# Patient Record
Sex: Female | Born: 1995 | Race: Black or African American | Hispanic: No | Marital: Single | Smoking: Never smoker
Health system: Southern US, Community
[De-identification: ages and names within clinical notes are randomized; demographics above are authoritative.]

## PROBLEM LIST (undated history)

## (undated) ENCOUNTER — Encounter

## (undated) ENCOUNTER — Telehealth

## (undated) ENCOUNTER — Ambulatory Visit

## (undated) ENCOUNTER — Encounter
Attending: Student in an Organized Health Care Education/Training Program | Primary: Student in an Organized Health Care Education/Training Program

## (undated) ENCOUNTER — Ambulatory Visit: Attending: Pharmacist | Primary: Pharmacist

## (undated) ENCOUNTER — Telehealth
Attending: Student in an Organized Health Care Education/Training Program | Primary: Student in an Organized Health Care Education/Training Program

## (undated) ENCOUNTER — Ambulatory Visit: Payer: PRIVATE HEALTH INSURANCE

## (undated) ENCOUNTER — Encounter: Attending: Family Medicine | Primary: Family Medicine

## (undated) ENCOUNTER — Encounter: Attending: Internal Medicine | Primary: Internal Medicine

## (undated) ENCOUNTER — Encounter: Payer: Worker's Compensation | Attending: Surgical Oncology | Primary: Surgical Oncology

## (undated) ENCOUNTER — Ambulatory Visit: Payer: MEDICAID | Attending: Internal Medicine | Primary: Internal Medicine

## (undated) DIAGNOSIS — G43909 Migraine, unspecified, not intractable, without status migrainosus: Secondary | ICD-10-CM

## (undated) DIAGNOSIS — T7840XA Allergy, unspecified, initial encounter: Secondary | ICD-10-CM

## (undated) HISTORY — DX: Allergy, unspecified, initial encounter: T78.40XA

## (undated) HISTORY — PX: FRACTURE SURGERY: SHX138

## (undated) HISTORY — DX: Migraine, unspecified, not intractable, without status migrainosus: G43.909

## (undated) MED ORDER — CHOLECALCIFEROL (VITAMIN D3) 1,250 MCG (50,000 UNIT) CAPSULE: 0.00000 days

## (undated) MED ORDER — MOMETASONE 0.1 % TOPICAL OINTMENT: 0.00000 days

## (undated) MED ORDER — EUCRISA 2 % TOPICAL OINTMENT: 0.00000 days

---

## 2015-08-12 ENCOUNTER — Ambulatory Visit (INDEPENDENT_AMBULATORY_CARE_PROVIDER_SITE_OTHER): Payer: Commercial Managed Care - PPO | Admitting: Emergency Medicine

## 2015-08-12 ENCOUNTER — Ambulatory Visit (INDEPENDENT_AMBULATORY_CARE_PROVIDER_SITE_OTHER): Payer: Commercial Managed Care - PPO

## 2015-08-12 ENCOUNTER — Other Ambulatory Visit: Payer: Self-pay | Admitting: Emergency Medicine

## 2015-08-12 VITALS — BP 102/68 | HR 74 | Temp 98.7°F | Resp 16 | Ht 59.0 in | Wt 87.0 lb

## 2015-08-12 DIAGNOSIS — M2392 Unspecified internal derangement of left knee: Secondary | ICD-10-CM

## 2015-08-12 MED ORDER — NAPROXEN SODIUM 550 MG PO TABS: 550.0000 mg | ORAL_TABLET | Freq: Two times a day (BID) | ORAL | Status: DC

## 2015-08-12 NOTE — Patient Instructions (Signed)

## 2015-08-12 NOTE — Progress Notes (Signed)
Subjective:  Patient ID: Chelsea Butler, female    DOB: Mar 04, 1996  Age: 19 y.o. MRN: [id number]  CC: Leg Pain   HPI Baudelia Schroepfer presents  with pain in her left knee. She was playing basketball yesterday and injured her knee. She's not sure how the injury occurred. She noticed she was unable to bear weight comfortably. Worsen today. She's had no particular swelling. She has no limitation of motion of her knee. No clicking or locking. Has taken no medication for treatment of her pain. She has no history of prior knee injury. 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   She has past surgical history that includes Fracture surgery.   Her  family history is not on file.  She   reports that she has never smoked. She does not have any smokeless tobacco history on file. Her alcohol and drug histories are not on file.  No outpatient prescriptions prior to visit.   No facility-administered medications prior to visit.    Social History   Social History  . Marital Status: Single    Spouse Name: N/A  . Number of Children: N/A  . Years of Education: N/A   Social History Main Topics  . Smoking status: Never Smoker   . Smokeless tobacco: None  . Alcohol Use: None  . Drug Use: None  . Sexual Activity: Not Asked   Other Topics Concern  . None   Social History Narrative  . None     Review of Systems  Constitutional: Negative for fever, chills and appetite change.  HENT: Negative for congestion, ear pain, postnasal drip, sinus pressure and sore throat.   Eyes: Negative for pain and redness.  Respiratory: Negative for cough, shortness of breath and wheezing.   Cardiovascular: Negative for leg swelling.  Gastrointestinal: Negative for nausea, vomiting, abdominal pain, diarrhea, constipation and blood in stool.  Endocrine: Negative for polyuria.  Genitourinary: Negative for dysuria, urgency, frequency and flank pain.    Musculoskeletal: Negative for gait problem.  Skin: Negative for rash.  Neurological: Negative for weakness and headaches.  Psychiatric/Behavioral: Negative for confusion and decreased concentration. The patient is not nervous/anxious.     Objective:  BP 102/68 mmHg  Pulse 74  Temp(Src) 98.7 F (37.1 C) (Oral)  Resp 16  Ht 4\' 11"  (1.499 m)  Wt 87 lb (39.463 kg)  BMI 17.56 kg/m2  SpO2 98%  LMP 08/04/2015  Physical Exam  Constitutional: She is oriented to person, place, and time. She appears well-developed and well-nourished. No distress.  HENT:  Head: Normocephalic and atraumatic.  Right Ear: External ear normal.  Left Ear: External ear normal.  Nose: Nose normal.  Eyes: Conjunctivae and EOM are normal. Pupils are equal, round, and reactive to light. No scleral icterus.  Neck: Normal range of motion. Neck supple. No tracheal deviation present.  Cardiovascular: Normal rate, regular rhythm and normal heart sounds.   Pulmonary/Chest: Effort normal. No respiratory distress. She has no wheezes. She has no rales.  Abdominal: She exhibits no mass. There is no tenderness. There is no rebound and no guarding.  Musculoskeletal: She exhibits no edema.  Lymphadenopathy:    She has no cervical adenopathy.  Neurological: She is alert and oriented to person, place, and time. Coordination normal.  Skin: Skin is warm and dry. No rash noted.  Psychiatric: She has a normal mood and affect. Her behavior is normal.      Assessment &  Plan:   Julieana was seen today for leg pain.  Diagnoses and all orders for this visit:  Internal derangement of knee, left -     Cancel: DG Knee Complete 4 Views Left; Future  Other orders -     naproxen sodium (ANAPROX DS) 550 MG tablet; Take 1 tablet (550 mg total) by mouth 2 (two) times daily with a meal.   I am having Ms. Corine Shelter start on naproxen sodium. I am also having her maintain her norethindrone-ethinyl estradiol-iron.  Meds ordered this  encounter  Medications  . norethindrone-ethinyl estradiol-iron (ESTROSTEP FE,TILIA FE,TRI-LEGEST FE) 1-20/1-30/1-35 MG-MCG tablet    Sig: Take 1 tablet by mouth daily.  . naproxen sodium (ANAPROX DS) 550 MG tablet    Sig: Take 1 tablet (550 mg total) by mouth 2 (two) times daily with a meal.    Dispense:  40 tablet    Refill:  0   I asked her to use crutches for nonweightbearing for several days and she can increase her weightbearing as tolerated. She should follow-up in a week if her pain is not improved elevation and ice the meantime  Appropriate red flag conditions were discussed with the patient as well as actions that should be taken.  Patient expressed his understanding.  Follow-up: Return in about 1 week (around 08/19/2015).  Carmelina Dane, MD   [facility] reading (PRIMARY) by  Dr. Dareen Piano negative.

## 2016-02-16 ENCOUNTER — Ambulatory Visit (INDEPENDENT_AMBULATORY_CARE_PROVIDER_SITE_OTHER): Payer: Commercial Managed Care - PPO | Admitting: Family Medicine

## 2016-02-16 VITALS — BP 108/64 | HR 84 | Temp 98.4°F | Resp 17 | Ht 60.0 in | Wt 86.0 lb

## 2016-02-16 DIAGNOSIS — G4452 New daily persistent headache (NDPH): Secondary | ICD-10-CM

## 2016-02-16 DIAGNOSIS — G43709 Chronic migraine without aura, not intractable, without status migrainosus: Secondary | ICD-10-CM | POA: Diagnosis not present

## 2016-02-16 DIAGNOSIS — R109 Unspecified abdominal pain: Secondary | ICD-10-CM | POA: Diagnosis not present

## 2016-02-16 DIAGNOSIS — J301 Allergic rhinitis due to pollen: Secondary | ICD-10-CM | POA: Diagnosis not present

## 2016-02-16 DIAGNOSIS — J0111 Acute recurrent frontal sinusitis: Secondary | ICD-10-CM

## 2016-02-16 LAB — POCT URINALYSIS DIP (MANUAL ENTRY)
BILIRUBIN UA: NEGATIVE
GLUCOSE UA: NEGATIVE
Ketones, POC UA: NEGATIVE
Leukocytes, UA: NEGATIVE
Nitrite, UA: NEGATIVE
PH UA: 7
Protein Ur, POC: NEGATIVE
RBC UA: NEGATIVE
SPEC GRAV UA: 1.02
UROBILINOGEN UA: 0.2

## 2016-02-16 LAB — POC MICROSCOPIC URINALYSIS (UMFC): Mucus: ABSENT

## 2016-02-16 LAB — POCT INFLUENZA A/B
Influenza A, POC: NEGATIVE
Influenza B, POC: NEGATIVE

## 2016-02-16 MED ORDER — AMOXICILLIN 500 MG PO TABS: 1000.0000 mg | ORAL_TABLET | Freq: Two times a day (BID) | ORAL | Status: DC

## 2016-02-16 MED ORDER — FLUTICASONE PROPIONATE 50 MCG/ACT NA SUSP: 2.0000 | Freq: Every day | NASAL | Status: AC

## 2016-02-16 MED ORDER — PREDNISONE 20 MG PO TABS: ORAL_TABLET | ORAL | Status: DC

## 2016-02-16 MED ORDER — IBUPROFEN 200 MG PO TABS
800.0000 mg | ORAL_TABLET | Freq: Once | ORAL | Status: AC
  Administered 2016-02-16: 800 mg via ORAL

## 2016-02-16 NOTE — Progress Notes (Signed)
Subjective:    Patient ID: Chelsea Butler, female    DOB: 09/16/1996, 20 y.o.   MRN: [id number]  02/16/2016  Migraine   HPI This 20 y.o. female presents for evaluation of migraine for five days.  Onset of migraines during childhood. Can usually take Ibuprofen with relief of symptoms.  Less frequent than in childhood.  Pressure behind both eyes which is not typical for migraine.  +photophobia initially; no phonophobia.  +nausea. Diffuse headache; migraines are usually one sided.  Severity 7/10.  Last Ibuprofen at 9:00pm; took 2 Ibuprofen 200mg  OTC.  No dizziness; no n/t.  No confusion.  No neck pain.  Diagnosed with migraines family physician; recently going to physician in [place]/neurologist. Has prescribed medication for migraines; cannot recall medications.     Recent illness.  Onset one week ago.  Pressure behind eyes developed three days ago.  No fever +chills/sweats.  +lower back pain on L this morning which is new.  No sore throat; no ear pain.  +rhinorrhea; no nasal congestion; clear rhinorrhea; no sneezing; eyes watering a lot; eyes are not itchy.  No facial itching; no coughing; no v/d.  No dysuria, hematuria, frequency, nocturia.  Taking Zyrtec daily.  Review of Systems  Constitutional: Negative for fever, chills, diaphoresis and fatigue.  Eyes: Negative for visual disturbance.  Respiratory: Negative for cough and shortness of breath.   Cardiovascular: Negative for chest pain, palpitations and leg swelling.  Gastrointestinal: Negative for nausea, vomiting, abdominal pain, diarrhea and constipation.  Endocrine: Negative for cold intolerance, heat intolerance, polydipsia, polyphagia and polyuria.  Neurological: Negative for dizziness, tremors, seizures, syncope, facial asymmetry, speech difficulty, weakness, light-headedness, numbness and headaches.    Past Medical History  Diagnosis Date  . Allergy   . Migraine    Past Surgical History  Procedure Laterality Date  .  Fracture surgery     No Known Allergies  Social History   Social History  . Marital Status: Single    Spouse Name: N/A  . Number of Children: N/A  . Years of Education: N/A   Occupational History  . student     [employer]   Social History Main Topics  . Smoking status: Never Smoker   . Smokeless tobacco: Not on file  . Alcohol Use: Not on file  . Drug Use: Not on file  . Sexual Activity: Not on file   Other Topics Concern  . Not on file   Social History Narrative   History reviewed. No pertinent family history.     Objective:    BP 108/64 mmHg  Pulse 84  Temp(Src) 98.4 F (36.9 C) (Oral)  Resp 17  Ht 5' (1.524 m)  Wt 86 lb (39.009 kg)  BMI 16.80 kg/m2  SpO2 96%  LMP 02/07/2016 Physical Exam  Constitutional: She is oriented to person, place, and time. She appears well-developed and well-nourished. No distress.  HENT:  Head: Normocephalic and atraumatic.  Right Ear: Tympanic membrane, external ear and ear canal normal.  Left Ear: Tympanic membrane, external ear and ear canal normal.  Nose: Mucosal edema and rhinorrhea present. Right sinus exhibits frontal sinus tenderness. Right sinus exhibits no maxillary sinus tenderness. Left sinus exhibits frontal sinus tenderness. Left sinus exhibits no maxillary sinus tenderness.  Mouth/Throat: Uvula is midline, oropharynx is clear and moist and mucous membranes are normal.  Eyes: Conjunctivae and EOM are normal. Pupils are equal, round, and reactive to light.  Neck: Normal range of motion. Neck supple. Carotid bruit is not present. No  thyromegaly present.  Cardiovascular: Normal rate, regular rhythm, normal heart sounds and intact distal pulses.  Exam reveals no gallop and no friction rub.   No murmur heard. Pulmonary/Chest: Effort normal and breath sounds normal. She has no wheezes. She has no rales.  Abdominal: Soft. Bowel sounds are normal. She exhibits no distension and no mass. There is no tenderness. There is no rebound  and no guarding.  Musculoskeletal:       Lumbar back: She exhibits tenderness and pain. She exhibits normal range of motion, no bony tenderness, no swelling and no spasm.       Back:  Lumbar spine:  Non-tender midline; +L paraspinal regions B.  Straight leg raises negative B; toe and heel walking intact; marching intact; motor 5/5 BLE.  Full ROM lumbar spine without limitation.   Lymphadenopathy:    She has no cervical adenopathy.  Neurological: She is alert and oriented to person, place, and time. No cranial nerve deficit.  Skin: Skin is warm and dry. No rash noted. She is not diaphoretic. No erythema. No pallor.  Psychiatric: She has a normal mood and affect. Her behavior is normal.   Results for orders placed or performed in visit on 02/16/16  POCT urinalysis dipstick  Result Value Ref Range   Color, UA yellow yellow   Clarity, UA clear clear   Glucose, UA negative negative   Bilirubin, UA negative negative   Ketones, POC UA negative negative   Spec Grav, UA 1.020    Blood, UA negative negative   pH, UA 7.0    Protein Ur, POC negative negative   Urobilinogen, UA 0.2    Nitrite, UA Negative Negative   Leukocytes, UA Negative Negative  POCT Microscopic Urinalysis ([facility])  Result Value Ref Range   WBC,UR,HPF,POC None None WBC/hpf   RBC,UR,HPF,POC None None RBC/hpf   Bacteria None None, Too numerous to count   Mucus Absent Absent   Epithelial Cells, UR Per Microscopy Few (A) None, Too numerous to count cells/hpf  POCT Influenza A/B  Result Value Ref Range   Influenza A, POC Negative Negative   Influenza B, POC Negative Negative   IBUPROFEN  PO ADMINISTERED IN OFFICE.    Assessment & Plan:   1. Left flank pain   2. New daily persistent headache   3. Chronic migraine without aura without status migrainosus, not intractable   4. Allergic rhinitis due to pollen   5. Acute recurrent frontal sinusitis    -New. -Rx for Prednisone, Amoxicillin, Flonase  provided. -continue Zyrtec daily.  Orders Placed This Encounter  Procedures  . POCT urinalysis dipstick  . POCT Microscopic Urinalysis ([facility])  . POCT Influenza A/B   Meds ordered this encounter  Medications  . ibuprofen (ADVIL,MOTRIN) tablet 800 mg    Sig:   . predniSONE (DELTASONE) 20 MG tablet    Sig: Two tablets daily x 3 days then one tablet daily x 3 days    Dispense:  9 tablet    Refill:  0  . amoxicillin (AMOXIL) 500 MG tablet    Sig: Take 2 tablets (1,000 mg total) by mouth 2 (two) times daily.    Dispense:  40 tablet    Refill:  0  . fluticasone (FLONASE) 50 MCG/ACT nasal spray    Sig: Place 2 sprays into both nostrils daily.    Dispense:  16 g    Refill:  6    Return if symptoms worsen or fail to improve.    Parvin Stetzer Paulita Fujita,  M.D. [facility] [address] [place], [place]  [address] [phone] phone [phone] fax

## 2016-11-20 IMAGING — CR DG KNEE AP/LAT W/ SUNRISE*L*
3 series · 3 of 3 positions shown · non-contrast
Comparison: None.

CLINICAL DATA: Medial knee pain, no injury

EXAM:
LEFT KNEE 3 VIEWS

[AP]
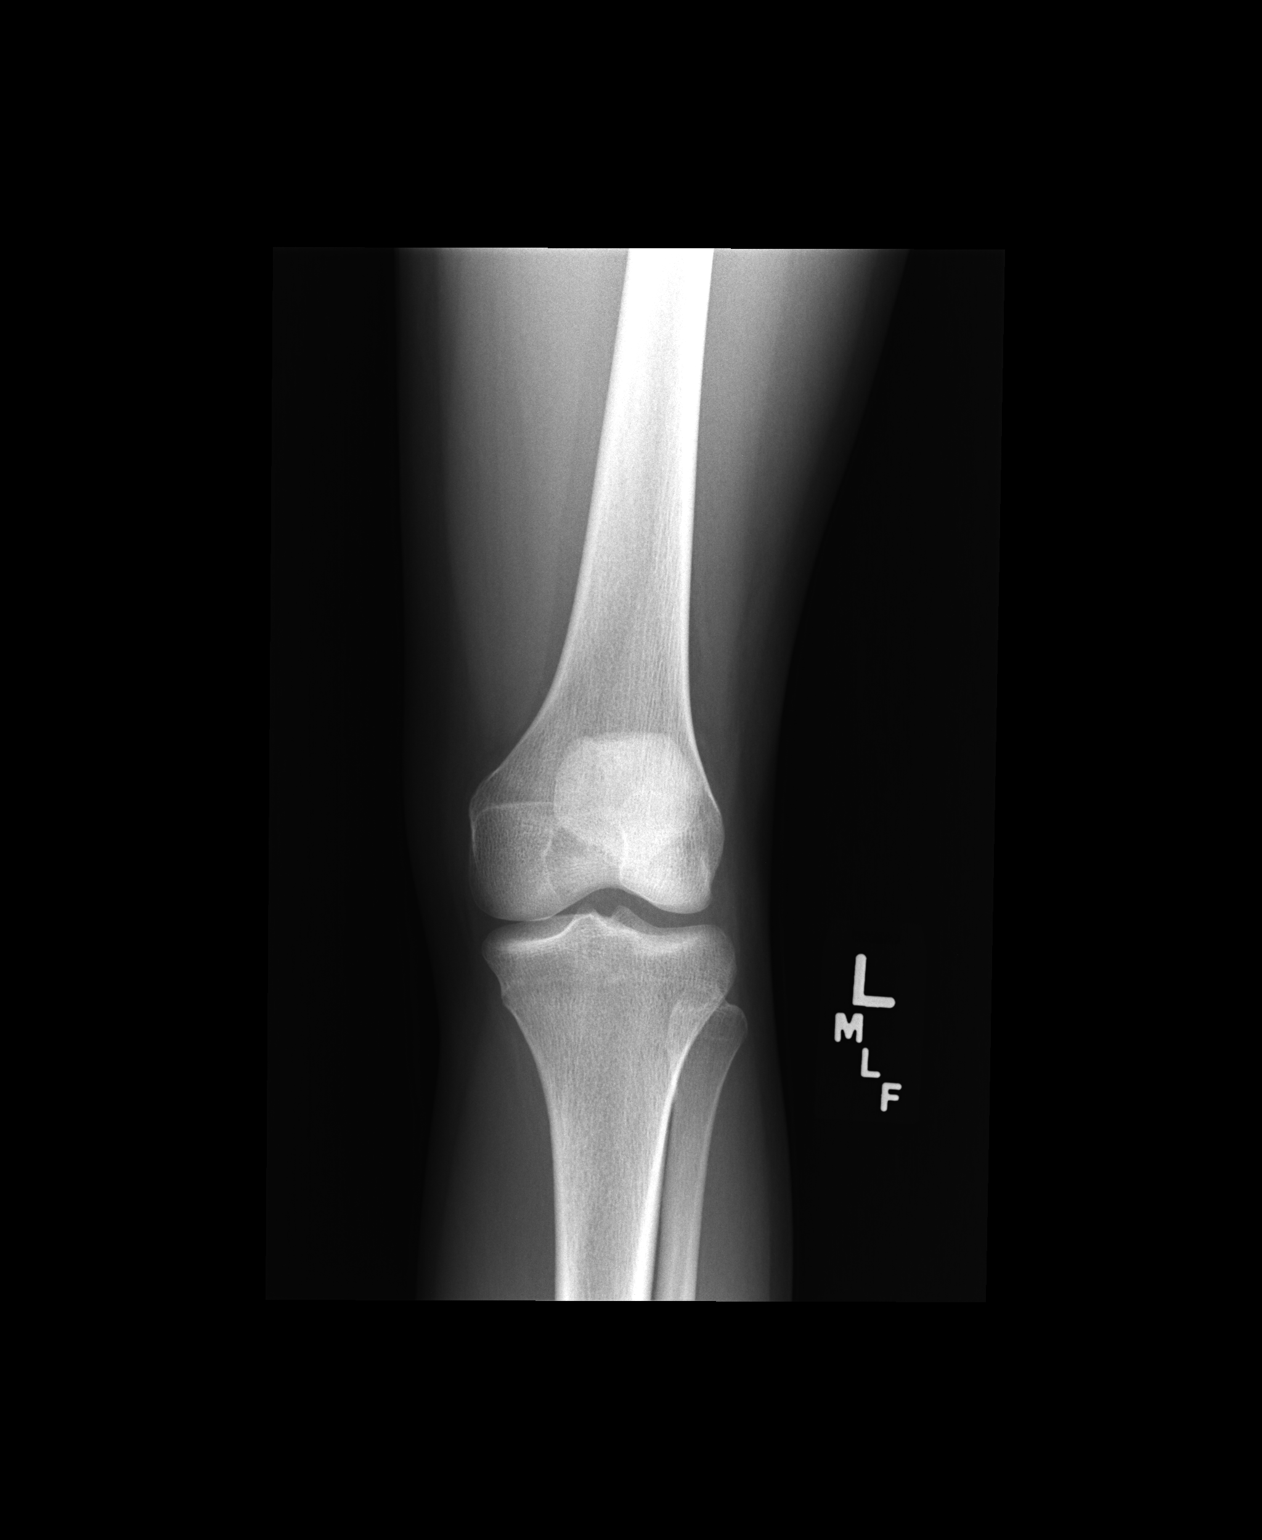

[lateral]
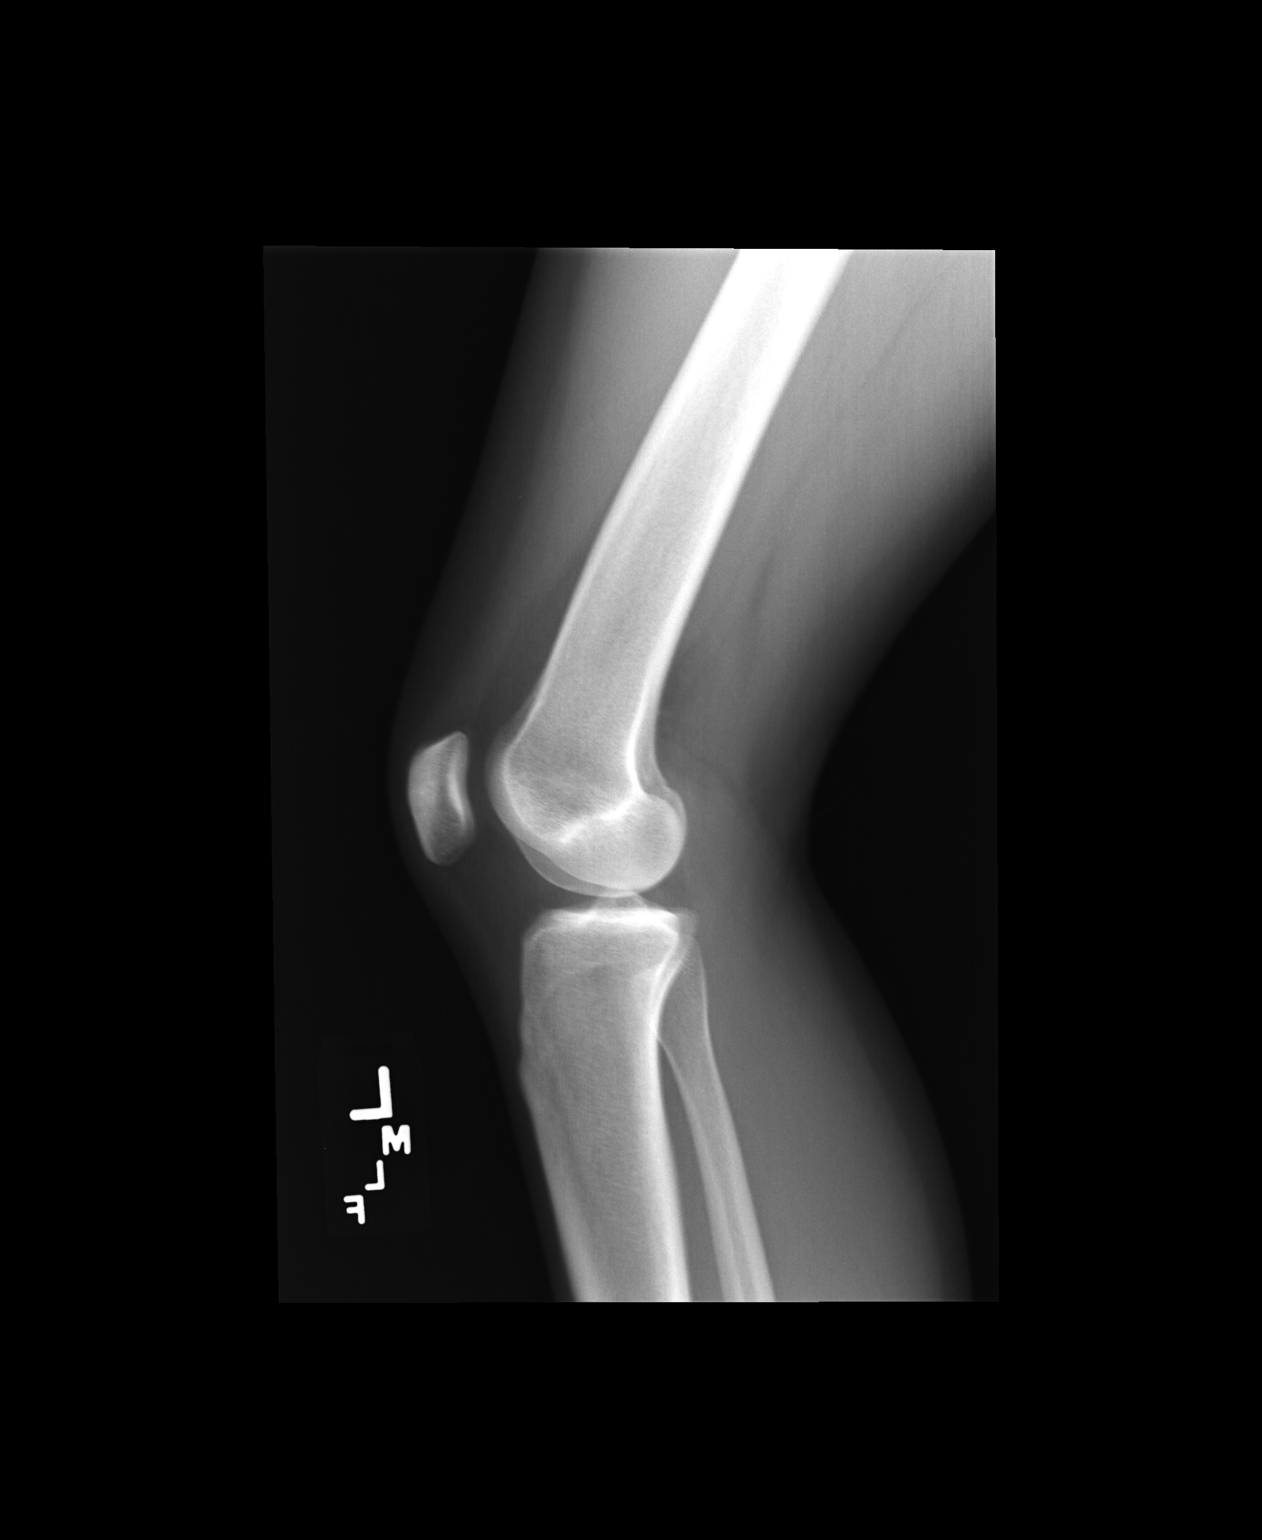

[sunrise]
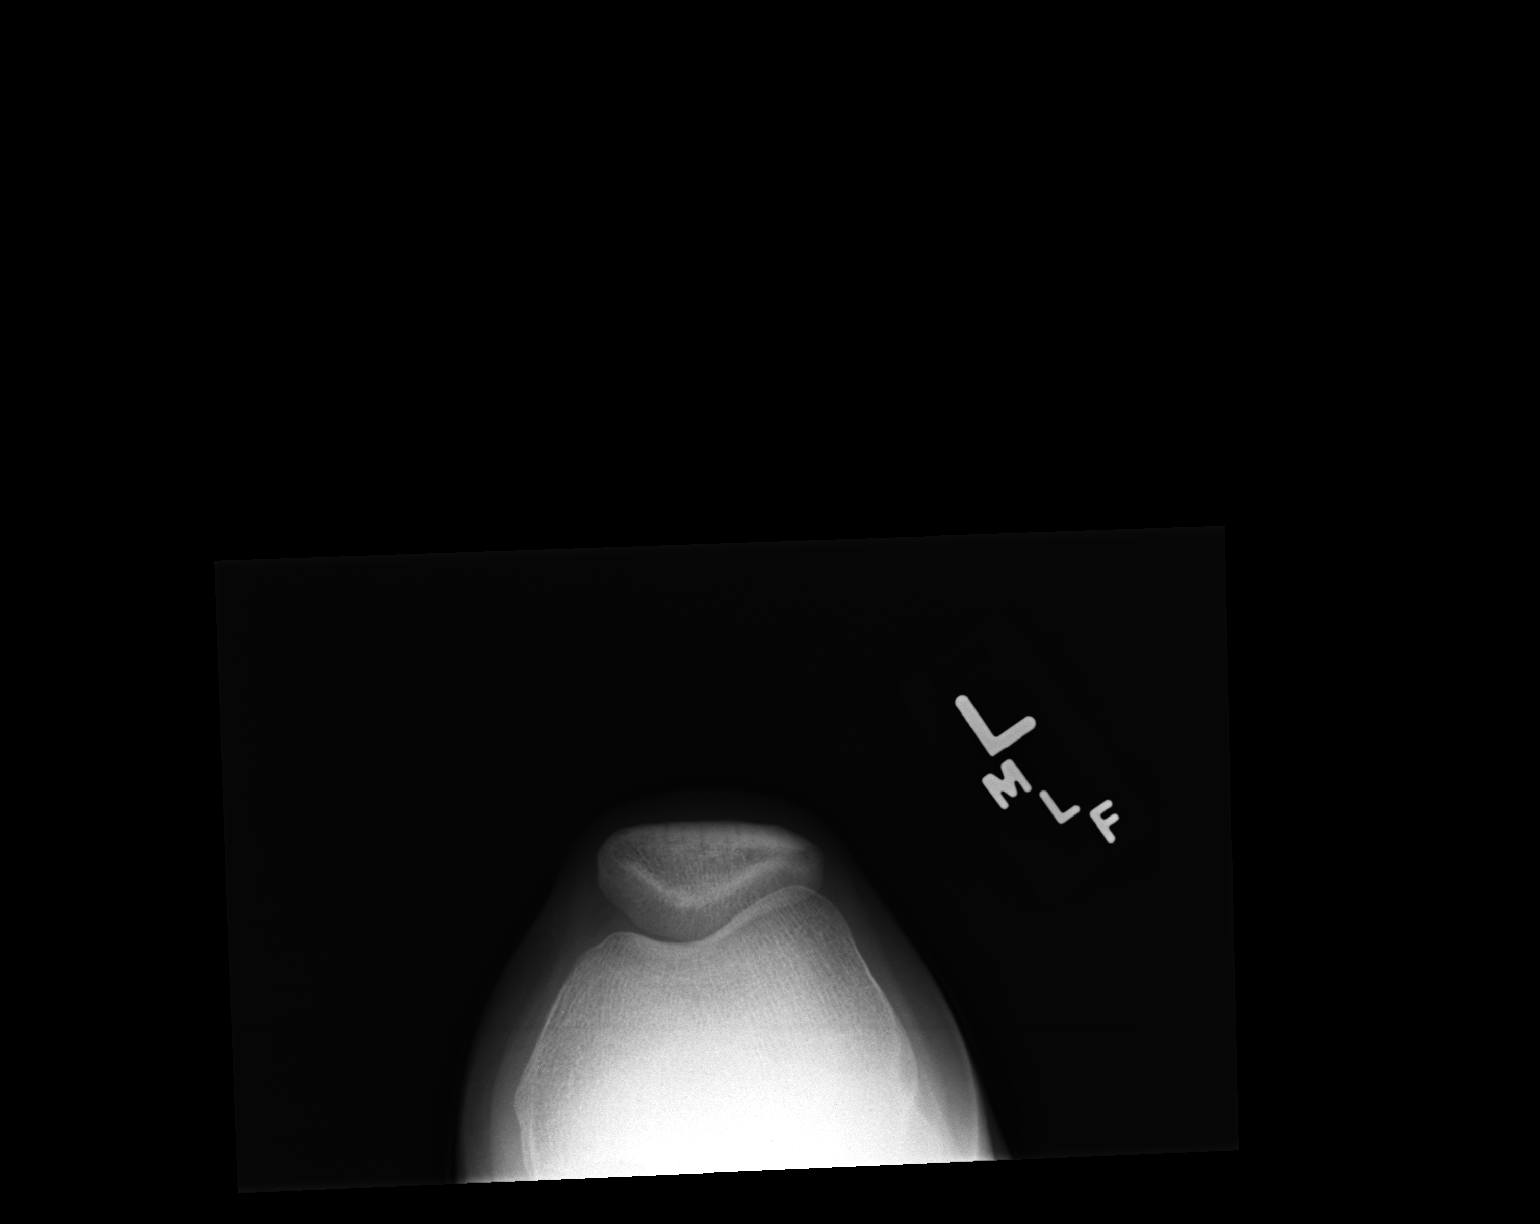

[3 of 3 positions shown; findings below may reference images not displayed]

FINDINGS: The knee joint spaces appear normal. No fracture is seen. No joint
effusion is noted.
IMPRESSION: Negative.

## 2018-02-21 ENCOUNTER — Encounter
Admit: 2018-02-21 | Discharge: 2018-02-21 | Payer: Worker's Compensation | Attending: Obstetrics & Gynecology | Primary: Obstetrics & Gynecology

## 2018-02-21 DIAGNOSIS — Z139 Encounter for screening, unspecified: Secondary | ICD-10-CM

## 2018-02-21 DIAGNOSIS — Z113 Encounter for screening for infections with a predominantly sexual mode of transmission: Principal | ICD-10-CM

## 2019-06-26 ENCOUNTER — Encounter
Admit: 2019-06-26 | Discharge: 2019-06-26 | Payer: Worker's Compensation | Attending: Obstetrics & Gynecology | Primary: Obstetrics & Gynecology

## 2019-06-26 DIAGNOSIS — Z113 Encounter for screening for infections with a predominantly sexual mode of transmission: Principal | ICD-10-CM

## 2019-11-24 ENCOUNTER — Encounter
Admit: 2019-11-24 | Discharge: 2019-11-25 | Payer: Worker's Compensation | Attending: Surgical Oncology | Primary: Surgical Oncology

## 2020-01-19 ENCOUNTER — Encounter
Admit: 2020-01-19 | Discharge: 2020-01-20 | Payer: Worker's Compensation | Attending: Surgical Oncology | Primary: Surgical Oncology

## 2020-01-30 ENCOUNTER — Ambulatory Visit: Payer: Commercial Managed Care - PPO | Admitting: Nurse Practitioner

## 2020-02-05 ENCOUNTER — Ambulatory Visit: Payer: Commercial Managed Care - PPO | Admitting: Nurse Practitioner

## 2020-02-05 ENCOUNTER — Encounter: Payer: Self-pay | Admitting: Nurse Practitioner

## 2020-02-05 ENCOUNTER — Other Ambulatory Visit: Payer: Self-pay

## 2020-02-05 VITALS — BP 102/68 | HR 108 | Temp 97.8°F | Ht 59.0 in | Wt 81.4 lb

## 2020-02-05 DIAGNOSIS — L309 Dermatitis, unspecified: Secondary | ICD-10-CM

## 2020-02-05 LAB — COMPREHENSIVE METABOLIC PANEL
ALT: 9 U/L (ref 0–35)
AST: 16 U/L (ref 0–37)
Albumin: 4.1 g/dL (ref 3.5–5.2)
Alkaline Phosphatase: 51 U/L (ref 39–117)
BUN: 10 mg/dL (ref 6–23)
CO2: 27 mEq/L (ref 19–32)
Calcium: 9.5 mg/dL (ref 8.4–10.5)
Chloride: 106 mEq/L (ref 96–112)
Creatinine, Ser: 0.95 mg/dL (ref 0.40–1.20)
GFR: 86.72 mL/min (ref >60.00)
Glucose, Bld: 101 mg/dL — ABNORMAL HIGH (ref 70–99)
Potassium: 4.3 mEq/L (ref 3.5–5.1)
Sodium: 138 mEq/L (ref 135–145)
Total Bilirubin: 0.5 mg/dL (ref 0.2–1.2)
Total Protein: 7 g/dL (ref 6.0–8.3)

## 2020-02-05 LAB — CBC
HCT: 40.1 % (ref 36.0–46.0)
Hemoglobin: 13.4 g/dL (ref 12.0–15.0)
MCHC: 33.5 g/dL (ref 30.0–36.0)
MCV: 96.7 fl (ref 78.0–100.0)
Platelets: 268 10*3/uL (ref 150.0–400.0)
RBC: 4.15 Mil/uL (ref 3.87–5.11)
RDW: 13.5 % (ref 11.5–15.5)
WBC: 4.2 10*3/uL (ref 4.0–10.5)

## 2020-02-05 LAB — VITAMIN D 25 HYDROXY (VIT D DEFICIENCY, FRACTURES): VITD: 15.89 ng/mL — ABNORMAL LOW (ref 30.00–100.00)

## 2020-02-05 LAB — TSH: TSH: 0.91 u[IU]/mL (ref 0.35–4.50)

## 2020-02-05 NOTE — Progress Notes (Signed)
Subjective:    Patient ID: Chelsea Butler, female    DOB: [birth year], 24 y.o.   MRN: [id number]  HPI  24 yo here to establish care. She has a new of eczema diagnosed Dr. Nehemiah Massed DERM onset 2019  and she was given tacrolimus ointment 0.1% . Her skin problem is getting worse and she was given Eucrisa 2%. Her mom suggested she get blood work. Saw GYN last month for irregular periods.  She has a it on her face, eyelids, cheek, around lips and spots on her hand and spots on arms, between thighs and legs. No food allergies.    Review of Systems  Constitutional: Negative for chills, fatigue and fever.  HENT: Negative for congestion, dental problem and ear pain.   Eyes: Negative for visual disturbance.  Respiratory: Negative for cough, chest tightness and shortness of breath.   Cardiovascular: Negative for chest pain, palpitations and leg swelling.  Gastrointestinal: Negative for abdominal pain, constipation, diarrhea and nausea.  Genitourinary: Negative for difficulty urinating.  Musculoskeletal: Negative for arthralgias, back pain and joint swelling.  Skin: Positive for rash.  Neurological: Positive for headaches. Negative for dizziness and seizures.       Hx of migraines onset as a child and saw neurologist. She gets them now every 2 weeks or longer. Easily managed with Aleve 400 mg resolves- no change form baseline.   Hematological: Negative for adenopathy. Does not bruise/bleed easily.  Psychiatric/Behavioral:       No concerns about depression/anxiety       Past Medical History:  Diagnosis Date  . Allergy   . Migraine      Past Surgical History:  Procedure Laterality Date  . FRACTURE SURGERY      History reviewed. No pertinent family history.  No Known Allergies  Current Outpatient Medications on File Prior to Visit  Medication Sig Dispense Refill  . AUROVELA 24 FE 1-20 MG-MCG(24) tablet Take 1 tablet by mouth daily.    . EUCRISA 2 % OINT Apply topically daily as  needed.     . fluticasone (FLONASE) 50 MCG/ACT nasal spray Place 2 sprays into both nostrils daily. 16 g 6  . mupirocin ointment (BACTROBAN) 2 % Apply topically as needed.    . tacrolimus (PROTOPIC) 0.1 % ointment Apply topically as needed.     No current facility-administered medications on file prior to visit.    BP 102/68   Pulse (!) 108   Temp 97.8 F (36.6 C) (Temporal)   Ht 4\' 11"  (1.499 m)   Wt 81 lb 6.4 oz (36.9 kg)   LMP 11/23/2019 (Exact Date)   SpO2 99%   BMI 16.44 kg/m    Objective:   Physical Exam Vitals reviewed.  Constitutional:      Appearance: Normal appearance.  HENT:     Head: Normocephalic.  Cardiovascular:     Rate and Rhythm: Normal rate and regular rhythm.  Pulmonary:     Effort: Pulmonary effort is normal.     Breath sounds: Normal breath sounds.  Abdominal:     General: Abdomen is flat.     Palpations: Abdomen is soft.     Tenderness: There is no abdominal tenderness.  Skin:    General: Skin is warm and dry.     Findings: Rash present.  Neurological:     General: No focal deficit present.     Mental Status: She is alert and oriented to person, place, and time.  Psychiatric:     Comments: No concerns  about depression/anxiety       Assessment & Plan:  It was nice to meet you today.   Eczema: Please go to the lab. Results will be given and follow to be determined.See Dr. Gwen Pounds this afternoon for continued care. We can check baseline labs recommended in UTD for eczema. No GERD or food allergies. No Hx of EOE.   Continue with well women exam with your GYN. Vit D returns low and will supplement with 2000 Iu daily and recheck lab  in 2 mos.

## 2020-02-05 NOTE — Patient Instructions (Signed)
It was nice to meet you today.   Please go to the lab. Results will be given and follow to be determined.  Continue with well women exam with your GYN.

## 2020-02-06 ENCOUNTER — Encounter: Payer: Self-pay | Admitting: Nurse Practitioner

## 2020-02-06 DIAGNOSIS — L309 Dermatitis, unspecified: Secondary | ICD-10-CM

## 2020-02-06 HISTORY — DX: Dermatitis, unspecified: L30.9

## 2020-02-24 ENCOUNTER — Telehealth: Payer: Self-pay

## 2020-02-24 MED ORDER — DUPIXENT 300 MG/2ML ~~LOC~~ SOAJ: 300.0000 mg | SUBCUTANEOUS | 5 refills | Status: AC

## 2020-02-24 MED ORDER — DUPIXENT 300 MG/2ML ~~LOC~~ SOSY: 600.0000 mg | PREFILLED_SYRINGE | Freq: Once | SUBCUTANEOUS | 0 refills | Status: AC

## 2020-02-24 NOTE — Telephone Encounter (Signed)
error 

## 2020-02-24 NOTE — Telephone Encounter (Signed)
Ok. Fine.  Please sched appt for pt.

## 2020-02-24 NOTE — Telephone Encounter (Signed)
Pt is already scheduled for April 12, does she need to be changed to later or earlier date

## 2020-02-24 NOTE — Telephone Encounter (Signed)
Pt called in to let Dr Kirtland Bouchard know she would like to start Dupixent injections. Per pts last office visit note in [facility] 02-05-20   Dupixent injection erx'd to [facility]

## 2020-03-10 ENCOUNTER — Encounter
Admit: 2020-03-10 | Discharge: 2020-03-11 | Disposition: A | Discharge status: Home / Self Care | Payer: Worker's Compensation

## 2020-03-10 DIAGNOSIS — L309 Dermatitis, unspecified: Principal | ICD-10-CM

## 2020-03-10 MED ORDER — HYDROCORTISONE 2.5 % TOPICAL OINTMENT
Freq: Two times a day (BID) | TOPICAL | 0 refills | 0 days | Status: CP
Start: 2020-03-10 — End: 2021-03-10

## 2020-03-10 MED ORDER — DOXYCYCLINE HYCLATE 100 MG CAPSULE
ORAL_CAPSULE | Freq: Two times a day (BID) | ORAL | 0 refills | 10.00000 days | Status: CP
Start: 2020-03-10 — End: 2020-03-20

## 2020-03-10 MED ORDER — CLOBETASOL 0.05 % TOPICAL OINTMENT
Freq: Two times a day (BID) | TOPICAL | 0 refills | 0.00000 days | Status: CP
Start: 2020-03-10 — End: 2021-03-10

## 2020-03-10 MED ORDER — TRIAMCINOLONE ACETONIDE 0.1 % TOPICAL OINTMENT
Freq: Two times a day (BID) | TOPICAL | 0 refills | 0 days | Status: CP
Start: 2020-03-10 — End: 2021-03-10

## 2020-03-11 ENCOUNTER — Other Ambulatory Visit: Payer: Self-pay

## 2020-03-11 ENCOUNTER — Ambulatory Visit: Payer: Commercial Managed Care - PPO | Admitting: Nurse Practitioner

## 2020-03-11 ENCOUNTER — Encounter: Payer: Self-pay | Admitting: Nurse Practitioner

## 2020-03-11 ENCOUNTER — Other Ambulatory Visit: Payer: Commercial Managed Care - PPO

## 2020-03-11 VITALS — BP 100/60 | HR 99 | Temp 97.7°F | Ht 59.0 in | Wt 77.0 lb

## 2020-03-11 DIAGNOSIS — L309 Dermatitis, unspecified: Secondary | ICD-10-CM

## 2020-03-11 DIAGNOSIS — E860 Dehydration: Secondary | ICD-10-CM

## 2020-03-11 DIAGNOSIS — R636 Underweight: Secondary | ICD-10-CM | POA: Diagnosis not present

## 2020-03-11 DIAGNOSIS — R634 Abnormal weight loss: Secondary | ICD-10-CM

## 2020-03-11 HISTORY — DX: Underweight: R63.6

## 2020-03-11 HISTORY — DX: Abnormal weight loss: R63.4

## 2020-03-11 LAB — URINALYSIS, ROUTINE W REFLEX MICROSCOPIC
Bilirubin Urine: NEGATIVE
Hgb urine dipstick: NEGATIVE
Ketones, ur: 15 — AB
Leukocytes,Ua: NEGATIVE
Nitrite: NEGATIVE
RBC / HPF: NONE SEEN (ref 0–?)
Specific Gravity, Urine: >=1.03 — AB (ref 1.000–1.030)
Total Protein, Urine: 30 — AB
Urine Glucose: NEGATIVE
Urobilinogen, UA: 0.2 (ref 0.0–1.0)
pH: 5.5 (ref 5.0–8.0)

## 2020-03-11 LAB — URINALYSIS, MICROSCOPIC ONLY: RBC / HPF: NONE SEEN (ref 0–?)

## 2020-03-11 MED ORDER — CHOLECALCIFEROL 1.25 MG (50000 UT) PO TABS: ORAL_TABLET | ORAL | 0 refills | Status: DC

## 2020-03-11 MED ORDER — CHOLECALCIFEROL 1.25 MG (50000 UT) PO TABS: ORAL_TABLET | ORAL | 0 refills | Status: AC

## 2020-03-11 NOTE — Patient Instructions (Addendum)
Your skin seems to be responding the treatment and looks like no infection is present.   Continue your course as ordered by your skin doctors. Finish the doxycyline .    See Dr. Gwen Pounds as planned.   Please stop the vitamin D 2000 IU dose.  I ordered a higher dose of Vit D 50,000 IU but it is only one pill  WEEKLY for 12 weeks.   You do seem to be dehydrated per BP checks and I will send you to the lab to check a urine.   DRINK-DRINK x 3 as much as you normally do.  Return for lab check in 12 weeks.    Eczema Eczema is a broad term for a group of skin conditions that cause skin to become rough and inflamed. Each type of eczema has different triggers, symptoms, and treatments. Eczema of any type is usually itchy and symptoms range from mild to severe. Eczema and its symptoms are not spread from person to person (are not contagious). It can appear on different parts of the body at different times. Your eczema may not look the same as someone else's eczema. What are the types of eczema? Atopic dermatitis This is a long-term (chronic) skin disease that keeps coming back (recurring). Usual symptoms are dry skin and small, solid pimples that may swell and leak fluid (weep). Contact dermatitis  This happens when something irritates the skin and causes a rash. The irritation can come from substances that you are allergic to (allergens), such as poison ivy, chemicals, or medicines that were applied to your skin. Dyshidrotic eczema This is a form of eczema on the hands and feet. It shows up as very itchy, fluid-filled blisters. It can affect people of any age, but is more common before age 55. Hand eczema  This causes very itchy areas of skin on the palms and sides of the hands and fingers. This type of eczema is common in industrial jobs where you may be exposed to many different types of irritants. Lichen simplex chronicus This type of eczema occurs when a person constantly scratches one  area of the body. Repeated scratching of the area leads to thickened skin (lichenification). Lichen simplex chronicus can occur along with other types of eczema. It is more common in adults, but may be seen in children as well. Nummular eczema This is a common type of eczema. It has no known cause. It typically causes a red, circular, crusty lesion (plaque) that may be itchy. Scratching may become a habit and can cause bleeding. Nummular eczema occurs most often in people of middle-age or older. It most often affects the hands. Seborrheic dermatitis This is a common skin disease that mainly affects the scalp. It may also affect any oily areas of the body, such as the face, sides of nose, eyebrows, ears, eyelids, and chest. It is marked by small scaling and redness of the skin (erythema). This can affect people of all ages. In infants, this condition is known as Location manager." Stasis dermatitis This is a common skin disease that usually appears on the legs and feet. It most often occurs in people who have a condition that prevents blood from being pumped through the veins in the legs (chronic venous insufficiency). Stasis dermatitis is a chronic condition that needs long-term management. How is eczema diagnosed? Your health care provider will examine your skin and review your medical history. He or she may also give you skin patch tests. These tests involve taking patches that  contain possible allergens and placing them on your back. He or she will then check in a few days to see if an allergic reaction occurred. What are the common treatments? Treatment for eczema is based on the type of eczema you have. Hydrocortisone steroid medicine can relieve itching quickly and help reduce inflammation. This medicine may be prescribed or obtained over-the-counter, depending on the strength of the medicine that is needed. Follow these instructions at home:  Take over-the-counter and prescription medicines only as  told by your health care provider.  Use creams or ointments to moisturize your skin. Do not use lotions.  Learn what triggers or irritates your symptoms. Avoid these things.  Treat symptom flare-ups quickly.  Do not itch your skin. This can make your rash worse.  Keep all follow-up visits as told by your health care provider. This is important. Where to find more information  The American Academy of Dermatology: http://jones-macias.info/  The National Eczema Association: www.nationaleczema.org Contact a health care provider if:  You have serious itching, even with treatment.  You regularly scratch your skin until it bleeds.  Your rash looks different than usual.  Your skin is painful, swollen, or more red than usual.  You have a fever. Summary  There are eight general types of eczema. Each type has different triggers.  Eczema of any type causes itching that may range from mild to severe.  Treatment varies based on the type of eczema you have. Hydrocortisone steroid medicine can help with itching and inflammation.  Protecting your skin is the best way to prevent eczema. Use moisturizers and lotions. Avoid triggers and irritants, and treat flare-ups quickly. This information is not intended to replace advice given to you by your health care provider. Make sure you discuss any questions you have with your health care provider. Document Revised: 11/02/2017 Document Reviewed: 04/05/2017 Elsevier Patient Education  [place].

## 2020-03-11 NOTE — Progress Notes (Addendum)
 Established Patient Office Visit  Subjective:  Patient ID: Chelsea Butler, female    DOB: 04/12/1996  Age: 24 y.o. MRN: [id number]  CC:  Chief Complaint  Patient presents with  . Follow-up    HPI Chelsea Butler presents for a follow-up after visiting the ED for eczema flare.   ECZEMA:  Chelsea Butler established care on 02/05/2020 and was referred to Dr. Kowalski for her eczema. He started her on topical skin creams and gave her a f/up appt for next week to discuss infusion therapy.   She had a flare of her eczema lat Monday beginning on her eyelids with crusting and then slowly progressed over the next 24 hours to drainage and severe eyelid swelling to the point she could not open them. She also had weeping at her groin lesions. She went to  the ED in [place]. The ED provider called a DERMATOLOGIST and they did a video scan of skin and the ED provider did a skin biopsy on elbow and groin area. The Dermatologist recommended her current treatment. She sees Dr. Kowalski on Monday.  Today, she feels fine. She presents NPO as she thought we could get her Vit D level checked today. It is too soon. She has tachycardia and stated her mother thinks she is not drinking enough and is dehydrated.  The patient has had no GI complaints such as nausea, vomiting, diarrhea, or constipation.  She says she just does not think about drinking.  Patient also has been eating lightly with her flare of eczema which causes pruritus, some difficulty sleeping.  She has always been small frame and less than 98 pounds.  She is tolerating the doxycycline 100 mg by mouth twice daily without any known side effects.  This has cleared up her drainage from the groin lesions. She also notes that the creams are drying up her eczema patches and she has less pruritus.   LOW VIT D: Last vitamin D Lab Results  Component Value Date   VD25OH 15.89 (L) 02/05/2020    UNDERWEIGHT: BMI 15.55.  Wt is down 4 lbs. She reports she has had  a hard time with the skin flare. The patient reports that she has always been very tiny.  She denies the eating disorder.  States she is mostly eating regular meals x2 per day but does skip sometimes. She reports not drinking enough. Wt 86 lb in 2017.  She reports allergy testing a couple years ago when the eczema started and she cannot recall being allergic to any foods for environmental items.  Lab Results  Component Value Date   TSH 0.91 02/05/2020    Wt Readings from Last 3 Encounters:  03/11/20 77 lb (34.9 kg)  02/05/20 81 lb 6.4 oz (36.9 kg)  02/16/16 86 lb (39 kg)   Temp Readings from Last 3 Encounters:  03/11/20 97.7 F (36.5 C) (Temporal)  02/05/20 97.8 F (36.6 C) (Temporal)  02/16/16 98.4 F (36.9 C) (Oral)   BP Readings from Last 3 Encounters:  03/11/20 100/60  02/05/20 102/68  02/16/16 108/64   Pulse Readings from Last 3 Encounters:  03/11/20 99  02/05/20 (!) 108  02/16/16 84     Past Medical History:  Diagnosis Date  . Allergy   . Eczema 02/06/2020  . Migraine   . Underweight 03/11/2020  . Weight loss, abnormal 03/11/2020    Past Surgical History:  Procedure Laterality Date  . FRACTURE SURGERY      History reviewed. No pertinent family history.    Social History   Socioeconomic History  . Marital status: Single    Spouse name: Not on file  . Number of children: Not on file  . Years of education: Not on file  . Highest education level: Not on file  Occupational History  . Occupation: student    Comment: [employer]  Tobacco Use  . Smoking status: Never Smoker  . Smokeless tobacco: Never Used  Substance and Sexual Activity  . Alcohol use: Not Currently    Alcohol/week: 0.0 standard drinks  . Drug use: Never  . Sexual activity: Not on file  Other Topics Concern  . Not on file  Social History Narrative  . Not on file   Social Determinants of Health   Financial Resource Strain:   . Difficulty of Paying Living Expenses:   Food Insecurity:   .  Worried About Charity fundraiser in the Last Year:   . Arboriculturist in the Last Year:   Transportation Needs:   . Film/video editor (Medical):   Marland Kitchen Lack of Transportation (Non-Medical):   Physical Activity:   . Days of Exercise per Week:   . Minutes of Exercise per Session:   Stress:   . Feeling of Stress :   Social Connections:   . Frequency of Communication with Friends and Family:   . Frequency of Social Gatherings with Friends and Family:   . Attends Religious Services:   . Active Member of Clubs or Organizations:   . Attends Archivist Meetings:   Marland Kitchen Marital Status:   Intimate Partner Violence:   . Fear of Current or Ex-Partner:   . Emotionally Abused:   Marland Kitchen Physically Abused:   . Sexually Abused:     Outpatient Medications Prior to Visit  Medication Sig Dispense Refill  . AUROVELA 24 FE 1-20 MG-MCG(24) tablet Take 1 tablet by mouth daily.    . clobetasol cream (TEMOVATE) 0.05 % Apply topically in the morning and at bedtime. Apply twice daily to arms and legs.    Marland Kitchen doxycycline (VIBRAMYCIN) 100 MG capsule Take 100 mg by mouth 2 (two) times daily. Take by mouth twice daily for 10 days.    . Dupilumab (DUPIXENT) 300 MG/2ML SOPN Inject 300 mg into the skin every 14 (fourteen) days. Starting at day 15 for maintenance. 2 pen 5  . fluticasone (FLONASE) 50 MCG/ACT nasal spray Place 2 sprays into both nostrils daily. 16 g 6  . hydrocortisone 2.5 % ointment Apply topically in the morning and at bedtime. Apply twice daily on the eyes.    . mupirocin ointment (BACTROBAN) 2 % Apply topically as needed.    . tacrolimus (PROTOPIC) 0.1 % ointment Apply topically as needed.    . triamcinolone ointment (KENALOG) 0.1 % Apply topically in the morning and at bedtime. Apply twice daily to the groin and axilla.    . EUCRISA 2 % OINT Apply topically daily as needed.      No facility-administered medications prior to visit.    No Known Allergies   Review of Systems    Constitutional: Positive for unexpected weight change. Negative for activity change, appetite change, chills, fatigue and fever.  HENT: Negative for congestion and sore throat.   Respiratory: Negative for cough and shortness of breath.   Cardiovascular: Negative for chest pain, palpitations and leg swelling.       Tachycardia noted and is asymptomatic. Orthostatic BP   Gastrointestinal: Negative for abdominal pain, blood in stool, constipation, diarrhea and  nausea.  Genitourinary: Negative for difficulty urinating, dysuria and menstrual problem.  Musculoskeletal: Negative for arthralgias, back pain, joint swelling and myalgias.  Skin: Positive for rash.       Skin examined and dried eczema patches scattered. Groin area- healing and no weeping or sign of cellulitis.   Allergic/Immunologic: Negative for environmental allergies, food allergies and immunocompromised state.  Neurological: Negative for dizziness, weakness and light-headedness.  Hematological: Negative for adenopathy.  Psychiatric/Behavioral:       No concerns about depression/anxiety      Objective:    Physical Exam  BP 100/60   Pulse 99   Temp 97.7 F (36.5 C) (Temporal)   Ht 4' 11" (1.499 m)   Wt 77 lb (34.9 kg)   SpO2 97%   BMI 15.55 kg/m  Wt Readings from Last 3 Encounters:  03/11/20 77 lb (34.9 kg)  02/05/20 81 lb 6.4 oz (36.9 kg)  02/16/16 86 lb (39 kg)   Supine: 98/64 with pulse 75  Sitting: 98/68 pulse 92  Standing: 92/62 pulse 99 I asked repeat heart rate 102  Health Maintenance Due  Topic Date Due  . HIV Screening  Never done  . PAP-Cervical Cytology Screening  Never done  . PAP SMEAR-Modifier  Never done    There are no preventive care reminders to display for this patient.  Lab Results  Component Value Date   TSH 0.91 02/05/2020   Lab Results  Component Value Date   WBC 4.2 02/05/2020   HGB 13.4 02/05/2020   HCT 40.1 02/05/2020   MCV 96.7 02/05/2020   PLT 268.0 02/05/2020   Lab  Results  Component Value Date   NA 138 02/05/2020   K 4.3 02/05/2020   CO2 27 02/05/2020   GLUCOSE 101 (H) 02/05/2020   BUN 10 02/05/2020   CREATININE 0.95 02/05/2020   BILITOT 0.5 02/05/2020   ALKPHOS 51 02/05/2020   AST 16 02/05/2020   ALT 9 02/05/2020   PROT 7.0 02/05/2020   ALBUMIN 4.1 02/05/2020   CALCIUM 9.5 02/05/2020   GFR 86.72 02/05/2020     Assessment & Plan:   Problem List Items Addressed This Visit      Musculoskeletal and Integument   Eczema - Primary     Other   Weight loss, abnormal   Underweight    Other Visit Diagnoses    Dehydration determined by examination       Relevant Orders   Urine Microscopic (Completed)   Urinalysis      Meds ordered this encounter  Medications  . DISCONTD: Cholecalciferol 1.25 MG (50000 UT) TABS    Sig: 50,000 units PO qwk for 8 weeks.    Dispense:  12 tablet    Refill:  0    Tell her to stop the vitamin D 2000 IU dose. Use this higher WEEKLY dose.  Return for lab check in 12 weeks.    Order Specific Question:   Supervising Provider    Answer:   SCOTT, CHARLENE [[id number]]   Drank a small bottle of water here in the exam room. To the lab 10 min later. Will check urine for specific gravity. No  GI problems. She was advised to eat regular meals- more than x2 per day and she needs to increase her calories. Keep calorie log.   Vit D level after high dose supplementation. Will also complete A1c, lipids, and discuss PAP at f/up visit. She is dealing with a current skin problem that has her concerned. Advised to increase   calories and not lose anymore weight.   Hydrate as urine is concentrated and she presented today fasting.   Follow-up: Return in about 2 weeks (around 03/25/2020).   Addendum: I did d/w my attending Dr. Nicki Reaper who advises repeat lab work : CBC with diff, Cmet, consider ESR (concern it may be elevated with her eczema.)  Not sure why she lost recent weight- although she does endorse eating less- x 2 per day. I  will talk with her again about eating disorder. She denied any GI complaints. I would like to do a weight check with diet recall in 2 weeks. If she loses more weight, I will need to do a full weight loss work up.     I changed her f/up time and I  LMOM her telephone machine to call us back in the morning. I will contact her with the new plan of care and the increased focus on her weight loss, need for labs now, and I will also talk with Dr. Nehemiah Massed abut this case before he sees her next week.   Chelsea Paradise, NP

## 2020-03-11 NOTE — Addendum Note (Signed)
Addended by: Warden Fillers on: 03/11/2020 03:29 PM   Modules accepted: Orders

## 2020-03-12 ENCOUNTER — Telehealth: Payer: Self-pay | Admitting: Nurse Practitioner

## 2020-03-12 ENCOUNTER — Other Ambulatory Visit: Payer: Commercial Managed Care - PPO

## 2020-03-12 ENCOUNTER — Other Ambulatory Visit: Payer: Self-pay | Admitting: *Deleted

## 2020-03-12 DIAGNOSIS — E860 Dehydration: Secondary | ICD-10-CM

## 2020-03-12 NOTE — Telephone Encounter (Signed)
Pt called and said Fransico Setters called patient and told her to call back. I do not see the telephone note where she called. Please call patient.

## 2020-03-12 NOTE — Telephone Encounter (Signed)
Did you want pt to call you back?

## 2020-03-14 LAB — URINE CULTURE
MICRO NUMBER:: 10346326
SPECIMEN QUALITY:: ADEQUATE

## 2020-03-15 ENCOUNTER — Other Ambulatory Visit: Payer: Self-pay

## 2020-03-15 ENCOUNTER — Telehealth: Payer: Self-pay | Admitting: Nurse Practitioner

## 2020-03-15 ENCOUNTER — Ambulatory Visit: Payer: Commercial Managed Care - PPO | Admitting: Dermatology

## 2020-03-15 ENCOUNTER — Encounter: Payer: Self-pay | Admitting: Dermatology

## 2020-03-15 DIAGNOSIS — Z872 Personal history of diseases of the skin and subcutaneous tissue: Secondary | ICD-10-CM

## 2020-03-15 DIAGNOSIS — L209 Atopic dermatitis, unspecified: Secondary | ICD-10-CM | POA: Diagnosis not present

## 2020-03-15 MED ORDER — DUPILUMAB 300 MG/2ML ~~LOC~~ SOAJ
600.0000 mg | Freq: Once | SUBCUTANEOUS | Status: AC
  Administered 2020-03-15: 600 mg via SUBCUTANEOUS

## 2020-03-15 NOTE — Telephone Encounter (Signed)
I spoke to [place] and she has no UTI symptoms at this time. She is on a new Eczema medication and is finishing a TIW DOXY course on Wed. I will check with Urology regarding the urgency of treating this finding. Of note, she is on oral birth control pill and does not often have a menstrual cycle as part of the contraception. Her last menses was in Dec. She does not think she is pregnant. She has NKDA.

## 2020-03-15 NOTE — Progress Notes (Signed)
   Follow-Up Visit   Subjective  Chelsea Butler is a 24 y.o. female who presents for the following: Dermatitis (- atopic dermatitis - severe - patient was in the hospital last week for condition (excessive weeping in the groin area, painful, and burning for at least 2 days) and they prescribed her TMC 0.1% oint BID to the groin and axilla, Clobetasol 0.05% ointment BID to the arms and legs, Hydrocortisone 2.5% ointment BID to the eyes, and Doxycycline 100mg  po BID x 10 days for secondary skin infection. Condition is much improved and patient is here today to start Dupixent injections. )  The following portions of the chart were reviewed this encounter and updated as appropriate: Tobacco  Allergies  Meds  Problems  Med Hx  Surg Hx  Fam Hx     Review of Systems: No other skin or systemic complaints.  Objective  Well appearing patient in no apparent distress; mood and affect are within normal limits.  A focused examination was performed including face, trunk, and extremities. Relevant physical exam findings are noted in the Assessment and Plan.  Objective  Hyperpigmentation, dyschromia and scarring on the groin, thighs, and abdomen.   Assessment & Plan  Atopic dermatitis, unspecified type With severe flare in groin requiring ER visit  Severe with history of impetigination - patient was last seen on 02/05/20 and had improved from her original visit, but last week patient was seen in the ER for excessive weeping of A.D. in the groin area. She also c/o pain and burning for at least two day before her ER visit. We had previously discussed Dupixent and the patient wanted to discuss medication with her parents before making a decision. She is here in the office today to start Dupixent.   Finish Doxycycline as prescribed by ER, but D/C TMC 0.1% oint, Clobetsol 0.05% oint, and HC 2.5% ointment. Restart Eucrisa oint to aa's BID and Mupirocin oint QD.   Loading dose of Dupixent 300mg /48mL (x2)  was injected SQ into the R and L ant thigh. Patient tolerated well. Lot number: Expiration date: 08/03/2021. AL, CMA   Return in about 2 weeks (around 03/29/2020) for F/U appt.08/05/2021, CMA, am acting as scribe for 03/31/2020, MD . Documentation: I have reviewed the above documentation for accuracy and completeness, and I agree with the above.  Maylene Roes, MD

## 2020-03-16 ENCOUNTER — Telehealth: Payer: Self-pay | Admitting: Nurse Practitioner

## 2020-03-16 DIAGNOSIS — R8271 Bacteriuria: Secondary | ICD-10-CM

## 2020-03-16 NOTE — Telephone Encounter (Signed)
Patient was informed via mychart.

## 2020-03-16 NOTE — Telephone Encounter (Signed)
Plan for Urine Cx with low growth strep:  Please come urine pregnancy. Repeat the UA after hydrating very well the day of and day before.   If she is pregnant- will need referral to OB/GYN.   If not pregnant and not having any of the above sx- she will not need antibiotic treatment for asymptomatic bacteruria.

## 2020-03-29 ENCOUNTER — Ambulatory Visit: Payer: Commercial Managed Care - PPO | Admitting: Dermatology

## 2020-04-07 ENCOUNTER — Telehealth: Payer: Self-pay | Admitting: Nurse Practitioner

## 2020-04-07 ENCOUNTER — Other Ambulatory Visit: Payer: Commercial Managed Care - PPO

## 2020-04-07 ENCOUNTER — Other Ambulatory Visit: Payer: Self-pay

## 2020-04-07 DIAGNOSIS — L309 Dermatitis, unspecified: Secondary | ICD-10-CM

## 2020-04-07 DIAGNOSIS — N3000 Acute cystitis without hematuria: Secondary | ICD-10-CM | POA: Diagnosis not present

## 2020-04-07 LAB — VITAMIN D 25 HYDROXY (VIT D DEFICIENCY, FRACTURES): VITD: 46.03 ng/mL (ref 30.00–100.00)

## 2020-04-07 MED ORDER — AMOXICILLIN 500 MG PO CAPS: 500.0000 mg | ORAL_CAPSULE | Freq: Three times a day (TID) | ORAL | 0 refills | Status: AC

## 2020-04-07 NOTE — Telephone Encounter (Signed)
Spoke with patient about below message. Patient has an appt to come in today for ov at 2pm. Patient wants to discuss this at her appt.

## 2020-04-07 NOTE — Telephone Encounter (Signed)
Chelsea Butler had a  positive strep urine culture with no symptoms  last month. She was advised to get a pregnancy test and if positive would treat.   I have reviewed UTD again and have reconsidered. Because of her chronic skin condition, this strep agalactiae bacteria in her urine needs to be resolved to prevent  bacteremia.   Plan: Please call her and advise: 1. Urine pregnancy test if any chance she could be pregnant. The order is already placed. 2. Begin  Amoxil 500 mg every 8 hours for 5 days 3. Follow up urine culture 1 week after treatment  to make sure the bacteria is resolved since we have no dysuria sx to follow.  4. She will need lab appt for urine culture in 2 weeks.  Thank you.

## 2020-04-08 LAB — URINE CULTURE
MICRO NUMBER:: 10442354
Result:: NO GROWTH
SPECIMEN QUALITY:: ADEQUATE

## 2020-04-15 MED ORDER — DUPIXENT 300 MG/2 ML SUBCUTANEOUS PEN INJECTOR
ORAL | 0 refills | 0.00000 days
Start: 2020-04-15 — End: ?

## 2020-04-22 ENCOUNTER — Ambulatory Visit: Payer: Commercial Managed Care - PPO | Admitting: Dermatology

## 2020-04-22 ENCOUNTER — Other Ambulatory Visit: Payer: Self-pay

## 2020-04-22 DIAGNOSIS — L2081 Atopic neurodermatitis: Secondary | ICD-10-CM | POA: Diagnosis not present

## 2020-04-22 MED ORDER — MUPIROCIN 2 % EX OINT: 1.0000 "application " | TOPICAL_OINTMENT | Freq: Every day | CUTANEOUS | 3 refills | Status: AC

## 2020-04-22 MED ORDER — DUPILUMAB 300 MG/2ML ~~LOC~~ SOAJ
300.0000 mg | Freq: Once | SUBCUTANEOUS | Status: AC
  Administered 2020-04-22: 300 mg via SUBCUTANEOUS

## 2020-04-22 NOTE — Progress Notes (Signed)
   Follow-Up Visit   Subjective  Chelsea Butler is a 24 y.o. female who presents for the following: Atopic Derm severe (face, R elbow, lower legs, feet, L buttocks, Dupixent loading dose 03/15/2020, tacrolimus prn face, improved on dupixent per pt).   The following portions of the chart were reviewed this encounter and updated as appropriate:  Tobacco  Allergies  Meds  Problems  Med Hx  Surg Hx  Fam Hx      Review of Systems:  No other skin or systemic complaints except as noted in HPI or Assessment and Plan.  Objective  Well appearing patient in no apparent distress; mood and affect are within normal limits.  A focused examination was performed including face, arms, legs. Relevant physical exam findings are noted in the Assessment and Plan.  Objective  face, arms, legs, buttocks, feet: Hyperpigmentation, dyschromia and scarring on the groin, thighs, and abdomen.   Photos today are post loading dose treatment  Images                         Assessment & Plan  Atopic neurodermatitis Severe atopic dermatitis not  improved on topical medications.  She has already had significant improvement of her rash and itch after her loading dose of Dupixent. face, arms, legs, buttocks, feet  Severe Improved from loading dose per pt, not itcing anymore  Cont Tacrolimus 0.1% oint qd/bid aa face prn flares Cont Dupixent 300mg /39ml sq injections q 2wks Cont Mupirocin oint qd to open sores  Dupixent 300mg /69ml sq injections today to L ant thigh, pt tolerated well. Discussed with pt how to self inject.  Discussed at next visit may have pt self inject.   Dupilumab SOPN 300 mg - face, arms, legs, buttocks, feet  mupirocin ointment (BACTROBAN) 2 % - face, arms, legs, buttocks, feet  Return in about 2 weeks (around 05/06/2020) for Dupixent.  I, 3m, RMA, am acting as scribe for 07/06/2020, MD . Documentation: I have reviewed the above documentation for  accuracy and completeness, and I agree with the above.  Ardis Rowan, MD

## 2020-04-23 MED ORDER — DUPIXENT 300 MG/2 ML SUBCUTANEOUS PEN INJECTOR: 300 mg | mL | 5 refills | 0 days

## 2020-04-25 ENCOUNTER — Encounter: Payer: Self-pay | Admitting: Dermatology

## 2020-04-26 DIAGNOSIS — L209 Atopic dermatitis, unspecified: Principal | ICD-10-CM

## 2020-04-27 ENCOUNTER — Other Ambulatory Visit: Payer: Self-pay | Admitting: Nurse Practitioner

## 2020-04-29 ENCOUNTER — Encounter: Admit: 2020-04-29 | Discharge: 2020-04-30 | Payer: Worker's Compensation | Attending: Dermatology | Primary: Dermatology

## 2020-04-29 DIAGNOSIS — H1089 Other conjunctivitis: Principal | ICD-10-CM

## 2020-04-29 DIAGNOSIS — L309 Dermatitis, unspecified: Principal | ICD-10-CM

## 2020-04-29 MED ORDER — CLOBETASOL 0.05 % TOPICAL OINTMENT
3 refills | 0 days | Status: CP
Start: 2020-04-29 — End: ?

## 2020-04-30 ENCOUNTER — Encounter
Admit: 2020-04-30 | Discharge: 2020-05-01 | Payer: Worker's Compensation | Attending: Student in an Organized Health Care Education/Training Program | Primary: Student in an Organized Health Care Education/Training Program

## 2020-05-06 ENCOUNTER — Ambulatory Visit (INDEPENDENT_AMBULATORY_CARE_PROVIDER_SITE_OTHER): Payer: Commercial Managed Care - PPO

## 2020-05-06 ENCOUNTER — Other Ambulatory Visit: Payer: Self-pay

## 2020-05-06 DIAGNOSIS — L209 Atopic dermatitis, unspecified: Secondary | ICD-10-CM | POA: Diagnosis not present

## 2020-05-06 MED ORDER — DUPILUMAB 300 MG/2ML ~~LOC~~ SOSY
300.0000 mg | PREFILLED_SYRINGE | Freq: Once | SUBCUTANEOUS | Status: AC
  Administered 2020-05-06: 300 mg via SUBCUTANEOUS

## 2020-05-06 NOTE — Progress Notes (Signed)
Patient here for 2 week Dupixent injection.   Dupixent 200mg /37mL injected SQ into patients right thigh. Patient tolerated well.  LOT: 3m EXP: 12/2021

## 2020-05-07 NOTE — Unmapped (Signed)
[facility] Specialty Medication Onboarding    Specialty Medication: DUPIXENT PENS LOADING AND MAINTENANCE DOSES  Prior Authorization: Approved   Financial Assistance: COPAY CARD APPROVED  Final Copay/Day Supply: $0 / 14 DAYS FOR LOADING AND 28 DAYS FOR MAINTENANCE    Insurance Restrictions: Yes - max 1 month supply     Notes to Pharmacist:     The triage team has completed the benefits investigation and has determined that the patient is able to fill this medication at [facility]. Please contact the patient to complete the onboarding or follow up with the prescribing physician as needed.

## 2020-05-10 NOTE — Unmapped (Signed)
Patient states that she has been getting injections from the doctors office for the last month and has already completed the initial dose of 2 pens.  She is using 1 pen every 14 days currently.        [facility]   Patient Onboarding/Medication Counseling    Mandy White is a 24 y.o. female with atopic dermatitis who I am counseling today on continuation of therapy.  I am speaking to the patient.    Was a Nurse, learning disability used for this call? No    Verified patient's date of birth / HIPAA.    Specialty medication(s) to be sent: Inflammatory Disorders: Dupixent      Non-specialty medications/supplies to be sent: sharps?      Medications not needed at this time: none         Dupixent (dupilumab)    Medication & Administration     Dosage: Atopic dermatitis: Inject 600mg  under the skin as a loading dose followed by 300mg  every 14 days thereafter    Administration:     Dupixent Pen  1. Gather all supplies needed for injection on a clean, flat working surface: medication syringe removed from packaging, alcohol swab, sharps container, etc.  2. Look at the medication label - look for correct medication, correct dose, and check the expiration date  3. Look at the medication - the liquid in the pen should appear clear and colorless to pale yellow  4. Lay the pen on a flat surface and allow it to warm up to room temperature for at least 45 minutes  5. Select injection site - you can use the front of your thigh or your belly (but not the area 2 inches around your belly button); if someone else is giving you the injection you can also use your upper arm in the skin covering your triceps muscle  6. Prepare injection site - wash your hands and clean the skin at the injection site with an alcohol swab and let it air dry, do not touch the injection site again before the injection  7. Hold the middle of the body of the pen and gently pull the needle safety cap straight out. Be careful not to bend the needle. Do not remove until immediately prior to injection  8. Press the pen down onto the injection site at a 90 degree angle.   9. You will hear a click as the injection starts, and then a second click when the injection is ALMOST done. Keep holding the pen against the skin for 5 more seconds after the second click.   10. Check that the pen is empty by looking in the viewing window - the yellow indicator bar should be stopped, and should fill the window.   11. Remove the pen from the skin by lifting straight up.   12. Dispose of the used pen immediately in your sharps disposal container  13. If you see any blood at the injection site, press a cotton ball or gauze on the site and maintain pressure until the bleeding stops, do not rub the injection site    Adherence/Missed dose instructions:  If a dose is missed, administer within 7 days from the missed dose and then resume the original schedule. If the missed dose is not administered within 7 days, you can either wait until the next dose on the original schedule or take your dose now and resume every 14 days from the new injection date. Do not use 2 doses  at the same time or extra doses.      Goals of Therapy     -Reduce symptoms of pruritus and dermatitis  -Prevent exacerbations  -Minimize therapeutic risks  -Avoidance of long-term systemic and topical glucocorticoid use  -Maintenance of effective psychosocial functioning    Side Effects & Monitoring Parameters     ??? Injection site reaction (redness, irritation, inflammation localized to the site of administration)  ??? Signs of a common cold ??? minor sore throat, runny or stuffy nose, etc.  ??? Recurrence of cold sores (herpes simplex)      The following side effects should be reported to the provider:  ??? Signs of a hypersensitivity reaction ??? rash; hives; itching; red, swollen, blistered, or peeling skin; wheezing; tightness in the chest or throat; difficulty breathing, swallowing, or talking; swelling of the mouth, face, lips, tongue, or throat; etc.  ??? Eye pain or irritation or any visual disturbances  ??? Shortness of breath or worsening of breathing      Contraindications, Warnings, & Precautions     ??? Have your bloodwork checked as you have been told by your prescriber   ??? Birth control pills and other hormone-based birth control may not work as well to prevent pregnancy  ??? Talk with your doctor if you are pregnant, planning to become pregnant, or breastfeeding  ??? Discuss the possible need for holding your dose(s) of Dupixent?? when a planned procedure is scheduled with the prescriber as it may delay healing/recovery timeline       Drug/Food Interactions     ??? Medication list reviewed in Epic. The patient was instructed to inform the care team before taking any new medications or supplements. No drug interactions identified.   ??? Talk with you prescriber or pharmacist before receiving any live vaccinations while taking this medication and after you stop taking it    Storage, Handling Precautions, & Disposal     ??? Store this medication in the refrigerator.  Do not freeze  ??? If needed, you may store at room temperature for up to 14 days  ??? Store in original packaging, protected from light  ??? Do not shake  ??? Dispose of used syringes in a sharps disposal container              Current Medications (including OTC/herbals), Comorbidities and Allergies     Current Outpatient Medications   Medication Sig Dispense Refill   ??? clobetasoL (TEMOVATE) 0.05 % ointment Apply twice daily to raised itchy areas until smooth 60 g 3   ??? dupilumab (DUPIXENT PEN) 300 mg/2 mL PnIj Inject the contents of 1 pen (300 mg) under the skin every fourteen (14) days. 4 mL 5   ??? mupirocin (BACTROBAN) 2 % ointment Apply 1 application topically Two (2) times a day.     ??? norethindrone-e.estradioL-iron (AUROVELA 24 FE) 1 mg-20 mcg (24)/75 mg (4) Tab Aurovela 24 Fe 1 mg-20 mcg (24)/75 mg (4) tablet   TAKE 1 TABLET BY MOUTH EVERY DAY     ??? tacrolimus (PROTOPIC) 0.1 % ointment tacrolimus 0.1 % topical ointment       No current facility-administered medications for this visit.       No Known Allergies    There is no problem list on file for this patient.      Reviewed and up to date in Epic.    Appropriateness of Therapy     Is medication and dose appropriate based on diagnosis? Yes    Prescription  has been clinically reviewed: Yes    Baseline Quality of Life Assessment      How many days over the past month did your atopic dermatitis  keep you from your normal activities? For example, brushing your teeth or getting up in the morning. 0    Financial Information     Medication Assistance provided: Prior Authorization and Copay Assistance    Anticipated copay of $0 reviewed with patient. Verified delivery address.    Delivery Information     Scheduled delivery date: 05/28/20    Expected start date: 05/28/20    Medication will be delivered via Same Day Courier to the prescription address in [facility].  This shipment will not require a signature.      Explained the services we provide at [facility] and that each month we would call to set up refills.  Stressed importance of returning phone calls so that we could ensure they receive their medications in time each month.  Informed patient that we should be setting up refills 7-10 days prior to when they will run out of medication.  A pharmacist will reach out to perform a clinical assessment periodically.  Informed patient that a welcome packet and a drug information handout will be sent.      Patient verbalized understanding of the above information as well as how to contact the pharmacy at [phone] option 4 with any questions/concerns.  The pharmacy is open Monday through Friday 8:30am-4:30pm.  A pharmacist is available 24/7 via pager to answer any clinical questions they may have.    Patient Specific Needs     - Does the patient have any physical, cognitive, or cultural barriers? No    - Patient prefers to have medications discussed with  Patient     - Is the patient or caregiver able to read and understand education materials at a high school level or above? Yes    - Patient's primary language is  English     - Is the patient high risk? No     - Does the patient require a Care Management Plan? No     - Does the patient require physician intervention or other additional services (i.e. nutrition, smoking cessation, social work)? No      Arnold Long  [facility] Specialty Pharmacist

## 2020-05-20 ENCOUNTER — Ambulatory Visit: Payer: Commercial Managed Care - PPO | Admitting: Dermatology

## 2020-05-20 ENCOUNTER — Other Ambulatory Visit: Payer: Self-pay

## 2020-05-20 ENCOUNTER — Telehealth: Payer: Self-pay

## 2020-05-20 DIAGNOSIS — L2081 Atopic neurodermatitis: Secondary | ICD-10-CM | POA: Diagnosis not present

## 2020-05-20 DIAGNOSIS — L819 Disorder of pigmentation, unspecified: Secondary | ICD-10-CM | POA: Diagnosis not present

## 2020-05-20 DIAGNOSIS — L209 Atopic dermatitis, unspecified: Secondary | ICD-10-CM

## 2020-05-20 DIAGNOSIS — L299 Pruritus, unspecified: Secondary | ICD-10-CM

## 2020-05-20 MED ORDER — MOMETASONE FUROATE 0.1 % EX OINT: TOPICAL_OINTMENT | CUTANEOUS | 2 refills | Status: AC

## 2020-05-20 MED ORDER — DUPILUMAB 300 MG/2ML ~~LOC~~ SOAJ
300.0000 mg | Freq: Once | SUBCUTANEOUS | Status: AC
  Administered 2020-05-20: 300 mg via SUBCUTANEOUS

## 2020-05-20 NOTE — Progress Notes (Signed)
   Follow-Up Visit   Subjective  Chelsea Butler is a 24 y.o. female who presents for the following: Dermatitis (Atopic Derm severe 4m f/u, face, body, improved, not itching as much, Dupixent 300mg /102ml sq q 2wks, Tacrolimus prn around eyes).   The following portions of the chart were reviewed this encounter and updated as appropriate:  Tobacco  Allergies  Meds  Problems  Med Hx  Surg Hx  Fam Hx      Review of Systems:  No other skin or systemic complaints except as noted in HPI or Assessment and Plan.  Objective  Well appearing patient in no apparent distress; mood and affect are within normal limits.  A focused examination was performed including face, arms. Relevant physical exam findings are noted in the Assessment and Plan.  Objective  Left Abdomen (side) - Upper: Hyperpigmentation, dyschromia and scarring bil arms  Images           Assessment & Plan    Atopic dermatitis, atopic neurodermatitis-severe -with dyschromia and pruritus -persistent, although improved on systemic medication Dupixent.  No side effects from treatment Left Abdomen (side) - Upper  Severe, improved all areas except arms on Dupixent  Cont Dupixent 300mg /32ml sq injections q 2 wks Cont Tacrolimus 0.1% oint qd/bid prn less severe flares Start Mometasone oint qd up to 5 days per week prn severe flares Cont Cln wash Cont Moisturizer qd Avoid moisturzers/soaps with fragrance  Dupixent 300mg /29ml sq injection to L ant thigh Lot 3m 10/03/20    Dupilumab SOPN 300 mg - Left Abdomen (side) - Upper  mometasone (ELOCON) 0.1 % ointment - Left Abdomen (side) - Upper  Return in about 2 weeks (around 06/03/2020) for 2wks with nurse, 62m with Dr. 10/05/20.   I, 08/04/2020, RMA, am acting as scribe for 0m, MD .  Documentation: I have reviewed the above documentation for accuracy and completeness, and I agree with the above.  Gwen Pounds, MD

## 2020-05-20 NOTE — Telephone Encounter (Signed)
Left pt msg that Dr. Gwen Pounds said if her mother wants to start doing her Dupixent injections he would like for her mother to come to the next 2 appointments, the nurse visit and the appointment with Dr. Gwen Pounds.  Advised her to call if she has any questions./sh

## 2020-05-23 ENCOUNTER — Encounter: Payer: Self-pay | Admitting: Dermatology

## 2020-05-24 NOTE — Unmapped (Signed)
[facility] Onboarding Note  Medication: Dupicent    Unable to reach patient to schedule shipment for medication being filled at [facility]. Left voicemail on phone.  As this is the 3rd unsuccessful attempt to reach the patient, no additional phone call attempts will be made at this time.      Phone numbers attempted: [phone]  Last scheduled delivery: n/a    Please call the [facility] at [phone] (option 4) should you have any further questions.      Thanks,  [facility] Specialty Team

## 2020-05-25 MED ORDER — EMPTY CONTAINER
0 refills | 0 days
Start: 2020-05-25 — End: ?

## 2020-05-28 MED FILL — EMPTY CONTAINER: 30 days supply | Qty: 1 | Fill #0

## 2020-05-28 MED FILL — DUPIXENT 300 MG/2 ML SUBCUTANEOUS PEN INJECTOR: 28 days supply | Qty: 4 | Fill #0 | Status: AC

## 2020-05-28 MED FILL — EMPTY CONTAINER: 30 days supply | Qty: 1 | Fill #0 | Status: AC

## 2020-05-28 MED FILL — DUPIXENT 300 MG/2 ML SUBCUTANEOUS PEN INJECTOR: SUBCUTANEOUS | 28 days supply | Qty: 4 | Fill #0

## 2020-06-03 ENCOUNTER — Ambulatory Visit: Payer: Commercial Managed Care - PPO

## 2020-06-14 NOTE — Unmapped (Signed)
[facility] Refill Coordination Note    Specialty Medication(s) to be Shipped:   Inflammatory Disorders: Dupixent    Other medication(s) to be shipped:       Mandy White, DOB: 1996-10-30  Phone: [phone] (home)       All above HIPAA information was verified with patient.     Was a Nurse, learning disability used for this call? No    Completed refill call assessment today to schedule patient's medication shipment from the [facility] [phone]).       Specialty medication(s) and dose(s) confirmed: Regimen is correct and unchanged.   Changes to medications: [name] reports no changes at this time.  Changes to insurance: No  Questions for the pharmacist: No    Confirmed patient received Welcome Packet with first shipment. The patient will receive a drug information handout for each medication shipped and additional FDA Medication Guides as required.       DISEASE/MEDICATION-SPECIFIC INFORMATION        For patients on injectable medications: Patient currently has 1 doses left.  Next injection is scheduled for 07/13.    SPECIALTY MEDICATION ADHERENCE     Medication Adherence    Patient reported X missed doses in the last month: 0  Specialty Medication: dupixent 300mg /61ml  Patient is on additional specialty medications: No  Informant: patient  Reliability of informant: reliable  Patient is at risk for Non-Adherence: No                DUPIXENT 300/2 mg/ml: 14 days of medicine on hand         SHIPPING     Shipping address confirmed in Epic.     Delivery Scheduled: Yes, Expected medication delivery date: 07/22.     Medication will be delivered via Same Day Courier to the prescription address in Epic WAM.    Antonietta Barcelona   [facility] Specialty Technician

## 2020-06-15 ENCOUNTER — Ambulatory Visit: Payer: Commercial Managed Care - PPO | Admitting: Nurse Practitioner

## 2020-06-15 DIAGNOSIS — Z0289 Encounter for other administrative examinations: Secondary | ICD-10-CM

## 2020-06-17 ENCOUNTER — Ambulatory Visit: Payer: Commercial Managed Care - PPO | Admitting: Dermatology

## 2020-06-24 MED FILL — DUPIXENT 300 MG/2 ML SUBCUTANEOUS PEN INJECTOR: 28 days supply | Qty: 4 | Fill #1 | Status: AC

## 2020-06-24 MED FILL — DUPIXENT 300 MG/2 ML SUBCUTANEOUS PEN INJECTOR: SUBCUTANEOUS | 28 days supply | Qty: 4 | Fill #1

## 2020-07-26 NOTE — Unmapped (Unsigned)
[facility] has made a third and final attempt to reach this patient to refill the following medication:dupixent.      We have left voicemails on the following phone numbers: [phone] and have sent a MyChart message.    Dates contacted: 08/13,08/17,08/23  Last scheduled delivery: 07/22    The patient may be at risk of non-compliance with this medication. The patient should call the [facility] at [phone] (option 4) to refill medication.    Antonietta Barcelona   [facility] Specialty Technician

## 2020-10-18 ENCOUNTER — Encounter: Admit: 2020-10-18 | Discharge: 2020-10-19 | Payer: Worker's Compensation

## 2020-10-18 DIAGNOSIS — L0100 Impetigo, unspecified: Principal | ICD-10-CM

## 2020-10-18 DIAGNOSIS — L308 Other specified dermatitis: Principal | ICD-10-CM

## 2020-10-18 MED ORDER — MUPIROCIN 2 % TOPICAL OINTMENT
TOPICAL | 2 refills | 0.00000 days | Status: CP
Start: 2020-10-18 — End: ?

## 2020-10-18 MED ORDER — CLOBETASOL 0.05 % TOPICAL OINTMENT
OPHTHALMIC | 3 refills | 0.00000 days | Status: CP
Start: 2020-10-18 — End: ?

## 2020-10-18 MED ORDER — DOXYCYCLINE HYCLATE 100 MG TABLET
ORAL_TABLET | Freq: Two times a day (BID) | ORAL | 0 refills | 14.00000 days | Status: CP
Start: 2020-10-18 — End: ?

## 2020-10-18 MED ORDER — TRIAMCINOLONE ACETONIDE 0.1 % TOPICAL OINTMENT
Freq: Two times a day (BID) | TOPICAL | 2 refills | 0.00000 days | Status: CP
Start: 2020-10-18 — End: 2021-10-18

## 2020-10-18 NOTE — Unmapped (Addendum)
For the rash:  - Start taking doxycycline twice a day with food and a full glass of water x 2 weeks. Stay upright for 30 minutes after taking. Sun protect while taking this medication.   - Start applying mupirocin ointment twice daily to active areas  - Today we obtained an aerobic culture. I will reach out to you with these results and we can discuss changing your treatment plan.  - Before we start a systemic medication, I will schedule you for patch testing.  - For active areas around your eyes, apply triamcinolone ointment twice daily for no more than 10 days at a time.  - For stubborn areas on your body, apply clobetasol ointment twice daily until clear.    Moisturize with thick emollients such as Vanicream or Cerave at least once daily.     Thicker moisturizers are preferred.  In general, those you have to scoop out of a jar instead of pump from a bottle are more moisturizing.        Over-the-counter moisturizers:    Cetaphil   Neutrogena ([place] Formula is great for hands)  Eucerin  CeraVe (particularly Cerave Healing Ointment)  Vanicream  Aquaphor         Cleansers:    Dove    Vanicream  Cetaphil  Oil of Olay

## 2020-10-18 NOTE — Unmapped (Signed)
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 To affected areas around eyes twice daily x 10 days, less if resolved  - Start doxycycline (VIBRA-TABS) 100 MG tablet; Take 1 tablet (100 mg total) by mouth Two (2) times a day x 2 weeks. Please take with food. R/B/A reviewed including GI upset  - Start mupirocin (BACTROBAN) 2 % ointment; To crusted areas around eyes, lower legs twice daily x 2 weeks  - Will schedule patient for patch testing  - start dilute bleach baths 2-3x/week (1/4 cup plain bleach to 1/4 tub water; 1/2 cup of bleach to 1/2 tub of water; 1 teaspoon bleach per gallon of water)  - previously could not tolerate Dupixent due to exacerbation of blepharitis symptoms (ophthy eval)  - could consider Cellcept vs methotrexate vs phototherapy if patch testing is negative      The patient was advised to call for an appointment should any new, changing, or symptomatic lesions develop.     RTC: Return for Patch testing. or sooner as needed   _________________________________________________________________    Chief Complaint     Chief Complaint   Patient presents with   ??? Eczema     Not doing good.  It is getting worse.  Stopped dupixent due to eye sight being changed. Kenalog cream will help with the itching for only a few days.      HPI     Mandy White is a 24 y.o. female who presents as a returning patient (last seen by Dr. Fara Boros and Dr. Dorna Bloom on 04/29/2020) to [facility] for follow up of eczema. At last visit, the patient continued dupixent, tacrolimus, and was started on clobetasol ointment for eczema.    Today she reports tender, itchy flares on her face, breasts, feet, and elbows after discontinuing dupixent due to vision changes. Patient is currently treating with almost daily triamcinolone 0.1% ointment with little relief.     The patient denies any other new or changing lesions or areas of concern.     Pertinent Past Medical History     No history of skin cancer   Atopic Dermatitis  Seasonal Allergies  No asthma    History:    The patient reports that she was diagnosed with atopic dermatitis approximately two years ago after developing a rash on her eyelids. She was initially treated with tacrolimus, which was helpful. In late 2020, her eczema spread to include her arms, legs and groin, and she was prescribed eucrisa for these areas. The eucrisa was not helpful, and she is scheduled to start Dupixent on 03/15/2020 with her Dermatologist at [facility]. She has noticed a worsening flare of her eczema in the last few days, but last night she developed weeping from her eczema in the groin. She denies any fevers or chills.   ??  She also notes that in late 2020, her underarms in the distribution of her deodorant were becoming increasingly painful and discolored. She stopped using the deodorant for three months, and her underarms began to clear up. Starting in March  2021, she resumed used of her Secret deodorant as the weather warmed up, and her underarms started to flare again. She has not been using it for the past week, and it has started to feel somewhat less inflamed. She denies using any wipes or other products in her groin other than occasional use of CLN soap and more recently hemp oil soap.     Family History:   Negative for melanoma   Father with eczema    Past Medical History, Family History, Social History, Medication List, Allergies, and Problem List were reviewed in the rooming section of Epic.   - Lives in [place], [place]    ROS: Other than symptoms mentioned in the HPI, no fevers, chills, or other skin complaints    Physical Examination     GENERAL: Well-appearing female in no acute distress, resting comfortably.  NEURO: Alert and oriented, answers questions appropriately  PSYCH: Normal mood and affect  SKIN (Full Skin Exam): Examination of the face, eyelids, lips, nose, ears, neck, chest, abdomen, back, arms, legs, hands, feet, palms, soles, nails was performed  - erythematous scaly plaques with overlying pustules and yellow crusting on upper eyelids, lower eyelids, and right lower leg  - lichenified erythematous thin plaques on bilateral axillae, hands, forearms, areolas, and lower legs    All areas not commented on are within normal limits or unremarkable    Scribe's Attestation: Newt Lukes, MD obtained and performed the history, physical exam and medical decision making elements that were entered into the chart. Signed by Mable Fill, Scribe, on October 18, 2020 at 1:06 PM.    ----------------------------------------------------------------------------------------------------------------------  October 18, 2020 7:57 PM. Documentation assistance provided by the Scribe. I was present during the time the encounter was recorded. The information recorded by the Scribe was done at my direction and has been reviewed and validated by me.    Jacobo Forest, MD  ----------------------------------------------------------------------------------------------------------------------       (Approved Template 08/16/2020)

## 2020-10-25 NOTE — Unmapped (Signed)
Continue doxycycline 100 mg BID to complete two week course.

## 2020-10-26 NOTE — Unmapped (Signed)
This patient has been disenrolled from the [facility] specialty pharmacy services due to multiple unsuccessful outreach attempts by the pharmacy.    Mandy White  [facility] Specialty Pharmacist

## 2021-02-14 ENCOUNTER — Encounter: Admit: 2021-02-14 | Discharge: 2021-02-15 | Payer: Worker's Compensation

## 2021-02-14 DIAGNOSIS — L239 Allergic contact dermatitis, unspecified cause: Principal | ICD-10-CM

## 2021-02-14 DIAGNOSIS — L308 Other specified dermatitis: Principal | ICD-10-CM

## 2021-02-16 ENCOUNTER — Encounter: Admit: 2021-02-16 | Discharge: 2021-02-17 | Payer: Worker's Compensation

## 2021-02-16 DIAGNOSIS — R21 Rash and other nonspecific skin eruption: Principal | ICD-10-CM

## 2021-02-18 ENCOUNTER — Encounter: Admit: 2021-02-18 | Discharge: 2021-02-19 | Payer: Worker's Compensation

## 2021-02-18 DIAGNOSIS — L2389 Allergic contact dermatitis due to other agents: Principal | ICD-10-CM

## 2021-02-18 DIAGNOSIS — L2489 Irritant contact dermatitis due to other agents: Principal | ICD-10-CM

## 2021-02-18 DIAGNOSIS — L2084 Intrinsic (allergic) eczema: Principal | ICD-10-CM

## 2021-02-18 DIAGNOSIS — Z79899 Other long term (current) drug therapy: Principal | ICD-10-CM

## 2021-02-18 MED ORDER — CLOBETASOL 0.05 % TOPICAL OINTMENT
OPHTHALMIC | 3 refills | 0.00000 days | Status: CP
Start: 2021-02-18 — End: ?

## 2021-02-18 MED ORDER — TRIAMCINOLONE ACETONIDE 0.1 % TOPICAL OINTMENT
Freq: Two times a day (BID) | TOPICAL | 2 refills | 0.00000 days | Status: CP
Start: 2021-02-18 — End: 2022-02-18

## 2021-09-28 ENCOUNTER — Ambulatory Visit: Admit: 2021-09-28 | Discharge: 2021-09-29 | Attending: Advanced Practice Midwife | Primary: Advanced Practice Midwife

## 2021-10-25 ENCOUNTER — Emergency Department
Admit: 2021-10-25 | Discharge: 2021-10-26 | Disposition: A | Discharge status: Home / Self Care | Payer: Worker's Compensation | Attending: Emergency Medicine

## 2021-10-25 ENCOUNTER — Ambulatory Visit
Admit: 2021-10-25 | Discharge: 2021-10-26 | Disposition: A | Discharge status: Home / Self Care | Payer: Worker's Compensation | Attending: Emergency Medicine

## 2021-10-25 DIAGNOSIS — L853 Xerosis cutis: Principal | ICD-10-CM

## 2021-10-25 DIAGNOSIS — L03115 Cellulitis of right lower limb: Principal | ICD-10-CM

## 2021-10-25 MED ORDER — CEPHALEXIN 500 MG CAPSULE
ORAL_CAPSULE | Freq: Three times a day (TID) | ORAL | 0 refills | 7 days | Status: CP
Start: 2021-10-25 — End: 2021-11-01

## 2021-10-25 MED ORDER — TRIAMCINOLONE ACETONIDE 0.1 % TOPICAL OINTMENT
Freq: Two times a day (BID) | TOPICAL | 0 refills | 0 days | Status: CP
Start: 2021-10-25 — End: 2021-12-24

## 2021-11-02 ENCOUNTER — Ambulatory Visit: Admit: 2021-11-02 | Discharge: 2021-11-03

## 2021-11-02 DIAGNOSIS — L03115 Cellulitis of right lower limb: Principal | ICD-10-CM

## 2021-11-02 MED ORDER — CEPHALEXIN 500 MG CAPSULE
ORAL_CAPSULE | Freq: Three times a day (TID) | ORAL | 0 refills | 14 days | Status: CP
Start: 2021-11-02 — End: 2021-11-12

## 2021-11-08 ENCOUNTER — Ambulatory Visit
Admit: 2021-11-08 | Discharge: 2021-11-09 | Attending: Student in an Organized Health Care Education/Training Program | Primary: Student in an Organized Health Care Education/Training Program

## 2021-11-08 DIAGNOSIS — L309 Dermatitis, unspecified: Principal | ICD-10-CM

## 2021-11-08 DIAGNOSIS — Z79899 Other long term (current) drug therapy: Principal | ICD-10-CM

## 2021-11-08 MED ORDER — TRIAMCINOLONE ACETONIDE 0.1 % TOPICAL OINTMENT
Freq: Two times a day (BID) | TOPICAL | 2 refills | 0.00000 days | Status: CP
Start: 2021-11-08 — End: 2021-11-08

## 2021-11-08 MED ORDER — TACROLIMUS 0.1 % TOPICAL OINTMENT
Freq: Two times a day (BID) | TOPICAL | 1 refills | 60.00000 days | Status: CP
Start: 2021-11-08 — End: 2022-11-08

## 2021-11-13 DIAGNOSIS — L2084 Intrinsic (allergic) eczema: Principal | ICD-10-CM

## 2021-11-13 MED ORDER — UPADACITINIB ER 15 MG TABLET,EXTENDED RELEASE 24 HR
ORAL_TABLET | Freq: Every day | ORAL | 2 refills | 0.00000 days | Status: CP
Start: 2021-11-13 — End: ?

## 2021-11-14 DIAGNOSIS — L2084 Intrinsic (allergic) eczema: Principal | ICD-10-CM

## 2021-12-09 ENCOUNTER — Ambulatory Visit: Admit: 2021-12-09 | Discharge: 2021-12-10 | Payer: PRIVATE HEALTH INSURANCE

## 2021-12-09 DIAGNOSIS — R636 Underweight: Principal | ICD-10-CM

## 2021-12-09 DIAGNOSIS — L0100 Impetigo, unspecified: Principal | ICD-10-CM

## 2021-12-09 MED ORDER — LO LOESTRIN FE 1 MG-10 MCG (24)/10 MCG (2) TABLET
ORAL_TABLET | Freq: Every day | ORAL | 3 refills | 84 days | Status: CP
Start: 2021-12-09 — End: ?

## 2021-12-09 MED ORDER — MUPIROCIN 2 % TOPICAL OINTMENT
Freq: Three times a day (TID) | TOPICAL | 11 refills | 0 days | Status: CP
Start: 2021-12-09 — End: 2021-12-14

## 2022-01-09 ENCOUNTER — Ambulatory Visit
Admit: 2022-01-09 | Discharge: 2022-01-09 | Payer: PRIVATE HEALTH INSURANCE | Attending: Student in an Organized Health Care Education/Training Program | Primary: Student in an Organized Health Care Education/Training Program

## 2022-01-09 DIAGNOSIS — L309 Dermatitis, unspecified: Principal | ICD-10-CM

## 2022-01-09 MED ORDER — TRIAMCINOLONE ACETONIDE 0.1 % TOPICAL OINTMENT
Freq: Two times a day (BID) | TOPICAL | 2 refills | 0.00000 days | Status: CP
Start: 2022-01-09 — End: 2023-01-09

## 2022-01-09 MED ORDER — TACROLIMUS 0.1 % TOPICAL OINTMENT
Freq: Two times a day (BID) | TOPICAL | 1 refills | 60.00000 days | Status: CP
Start: 2022-01-09 — End: 2023-01-09

## 2022-02-06 ENCOUNTER — Ambulatory Visit: Admit: 2022-02-06 | Discharge: 2022-02-07 | Payer: PRIVATE HEALTH INSURANCE

## 2022-02-06 DIAGNOSIS — R590 Localized enlarged lymph nodes: Principal | ICD-10-CM

## 2022-02-06 DIAGNOSIS — R636 Underweight: Principal | ICD-10-CM

## 2022-02-06 MED ORDER — MIRTAZAPINE 7.5 MG TABLET
ORAL_TABLET | Freq: Every evening | ORAL | 3 refills | 30 days | Status: CP
Start: 2022-02-06 — End: 2022-03-08

## 2022-02-06 MED ORDER — CETIRIZINE 10 MG TABLET
ORAL_TABLET | Freq: Every day | ORAL | 3 refills | 90 days | Status: CP
Start: 2022-02-06 — End: 2023-02-06

## 2022-02-27 ENCOUNTER — Ambulatory Visit
Admit: 2022-02-27 | Discharge: 2022-02-28 | Payer: PRIVATE HEALTH INSURANCE | Attending: Student in an Organized Health Care Education/Training Program | Primary: Student in an Organized Health Care Education/Training Program

## 2022-02-27 DIAGNOSIS — Z79899 Other long term (current) drug therapy: Principal | ICD-10-CM

## 2022-02-27 DIAGNOSIS — L309 Dermatitis, unspecified: Principal | ICD-10-CM

## 2022-02-27 MED ORDER — TACROLIMUS 0.1 % TOPICAL OINTMENT
Freq: Two times a day (BID) | TOPICAL | 1 refills | 60 days | Status: CP
Start: 2022-02-27 — End: 2023-02-27

## 2022-02-27 MED ORDER — TRIAMCINOLONE ACETONIDE 0.1 % TOPICAL OINTMENT
Freq: Two times a day (BID) | TOPICAL | 2 refills | 0 days | Status: CP
Start: 2022-02-27 — End: 2023-02-27

## 2022-03-16 ENCOUNTER — Ambulatory Visit: Admit: 2022-03-16 | Discharge: 2022-03-17 | Payer: PRIVATE HEALTH INSURANCE

## 2022-03-16 DIAGNOSIS — R636 Underweight: Principal | ICD-10-CM

## 2022-03-16 MED ORDER — MIRTAZAPINE 15 MG TABLET
ORAL_TABLET | Freq: Every evening | ORAL | 3 refills | 30 days | Status: CP
Start: 2022-03-16 — End: 2022-04-15

## 2022-04-19 ENCOUNTER — Ambulatory Visit: Admit: 2022-04-19 | Discharge: 2022-04-20 | Payer: PRIVATE HEALTH INSURANCE

## 2022-06-19 ENCOUNTER — Ambulatory Visit: Admit: 2022-06-19 | Discharge: 2022-06-20 | Payer: PRIVATE HEALTH INSURANCE

## 2022-06-19 DIAGNOSIS — Z79899 Other long term (current) drug therapy: Principal | ICD-10-CM

## 2022-06-19 DIAGNOSIS — L2084 Intrinsic (allergic) eczema: Principal | ICD-10-CM

## 2022-06-19 MED ORDER — TACROLIMUS 0.1 % TOPICAL OINTMENT
3 refills | 0 days | Status: CP
Start: 2022-06-19 — End: ?

## 2022-06-19 MED ORDER — CLOBETASOL 0.05 % TOPICAL OINTMENT
3 refills | 0 days | Status: CP
Start: 2022-06-19 — End: ?

## 2022-07-26 ENCOUNTER — Ambulatory Visit: Admit: 2022-07-26 | Discharge: 2022-07-27 | Payer: PRIVATE HEALTH INSURANCE

## 2022-07-26 DIAGNOSIS — Z79899 Other long term (current) drug therapy: Principal | ICD-10-CM

## 2022-07-26 DIAGNOSIS — L2084 Intrinsic (allergic) eczema: Principal | ICD-10-CM

## 2022-07-28 ENCOUNTER — Ambulatory Visit
Admit: 2022-07-28 | Discharge: 2022-07-29 | Payer: PRIVATE HEALTH INSURANCE | Attending: Student in an Organized Health Care Education/Training Program | Primary: Student in an Organized Health Care Education/Training Program

## 2022-07-28 DIAGNOSIS — L2084 Intrinsic (allergic) eczema: Principal | ICD-10-CM

## 2022-07-28 MED ORDER — NEOMYCIN-POLYMYXIN-DEXAMETH 3.5 MG/ML-10,000 UNIT/ML-0.1% EYE DROPS
Freq: Every day | OPHTHALMIC | 5 refills | 100 days | Status: CP
Start: 2022-07-28 — End: ?

## 2022-09-07 DIAGNOSIS — L2084 Intrinsic (allergic) eczema: Principal | ICD-10-CM

## 2022-09-07 DIAGNOSIS — L2089 Other atopic dermatitis: Principal | ICD-10-CM

## 2023-03-12 ENCOUNTER — Ambulatory Visit: Admit: 2023-03-12 | Discharge: 2023-03-13 | Payer: PRIVATE HEALTH INSURANCE

## 2023-03-12 DIAGNOSIS — L309 Dermatitis, unspecified: Principal | ICD-10-CM

## 2023-03-12 DIAGNOSIS — R636 Underweight: Principal | ICD-10-CM

## 2023-03-12 DIAGNOSIS — R6889 Other general symptoms and signs: Principal | ICD-10-CM

## 2023-03-12 DIAGNOSIS — L2084 Intrinsic (allergic) eczema: Principal | ICD-10-CM

## 2023-03-12 MED ORDER — CLOBETASOL 0.05 % TOPICAL OINTMENT
3 refills | 0 days | Status: CP
Start: 2023-03-12 — End: ?

## 2023-03-12 MED ORDER — FLUOCINOLONE 0.01 % SCALP OIL AND SHOWER CAP
0 refills | 0 days | Status: CP
Start: 2023-03-12 — End: ?

## 2023-03-12 MED ORDER — MIRTAZAPINE 7.5 MG TABLET
ORAL_TABLET | Freq: Every evening | ORAL | 3 refills | 90 days | Status: CP
Start: 2023-03-12 — End: ?

## 2023-03-12 MED ORDER — TACROLIMUS 0.1 % TOPICAL OINTMENT
3 refills | 0 days | Status: CP
Start: 2023-03-12 — End: ?

## 2023-04-10 ENCOUNTER — Ambulatory Visit: Admit: 2023-04-10 | Discharge: 2023-04-11 | Payer: PRIVATE HEALTH INSURANCE

## 2023-04-10 DIAGNOSIS — L309 Dermatitis, unspecified: Principal | ICD-10-CM

## 2023-04-10 DIAGNOSIS — R636 Underweight: Principal | ICD-10-CM

## 2023-04-10 DIAGNOSIS — Z113 Encounter for screening for infections with a predominantly sexual mode of transmission: Principal | ICD-10-CM

## 2023-04-10 DIAGNOSIS — N898 Other specified noninflammatory disorders of vagina: Principal | ICD-10-CM

## 2023-04-11 MED ORDER — METRONIDAZOLE 0.75 % (37.5 MG/5 GRAM) VAGINAL GEL
0 refills | 0 days | Status: CP
Start: 2023-04-11 — End: ?

## 2023-05-15 ENCOUNTER — Ambulatory Visit: Admit: 2023-05-15 | Discharge: 2023-05-16 | Payer: PRIVATE HEALTH INSURANCE

## 2024-08-19 ENCOUNTER — Encounter: Admit: 2024-08-19 | Discharge: 2024-08-19 | Payer: PRIVATE HEALTH INSURANCE

## 2024-08-19 DIAGNOSIS — R636 Underweight: Principal | ICD-10-CM

## 2024-08-19 DIAGNOSIS — Z01419 Encounter for gynecological examination (general) (routine) without abnormal findings: Principal | ICD-10-CM

## 2024-08-19 DIAGNOSIS — Z124 Encounter for screening for malignant neoplasm of cervix: Principal | ICD-10-CM

## 2024-08-19 DIAGNOSIS — Z113 Encounter for screening for infections with a predominantly sexual mode of transmission: Principal | ICD-10-CM

## 2024-08-19 DIAGNOSIS — Z79899 Other long term (current) drug therapy: Principal | ICD-10-CM

## 2024-08-19 MED ORDER — AUROVELA 24 FE 1 MG-20 MCG (24)/75 MG (4) TABLET
ORAL_TABLET | Freq: Every day | ORAL | 3 refills | 0.00000 days | Status: CP
Start: 2024-08-19 — End: ?

## 2024-08-25 ENCOUNTER — Encounter: Admit: 2024-08-25 | Discharge: 2024-08-26 | Payer: PRIVATE HEALTH INSURANCE

## 2024-09-09 ENCOUNTER — Ambulatory Visit: Admit: 2024-09-09 | Discharge: 2024-09-10 | Payer: MEDICAID

## 2024-09-09 DIAGNOSIS — R636 Underweight: Principal | ICD-10-CM
